# Patient Record
Sex: Female | Born: 1985 | Race: White | Hispanic: Yes | Marital: Single | Smoking: Never smoker
Health system: Southern US, Community
[De-identification: ages and names within clinical notes are randomized; demographics above are authoritative.]

## PROBLEM LIST (undated history)

## (undated) DIAGNOSIS — E282 Polycystic ovarian syndrome: Secondary | ICD-10-CM

## (undated) DIAGNOSIS — G43909 Migraine, unspecified, not intractable, without status migrainosus: Secondary | ICD-10-CM

## (undated) DIAGNOSIS — R87810 Cervical high risk human papillomavirus (HPV) DNA test positive: Secondary | ICD-10-CM

## (undated) DIAGNOSIS — F419 Anxiety disorder, unspecified: Secondary | ICD-10-CM

## (undated) DIAGNOSIS — R8789 Other abnormal findings in specimens from female genital organs: Secondary | ICD-10-CM

## (undated) HISTORY — DX: Polycystic ovarian syndrome: E28.2

## (undated) HISTORY — PX: BUTTOCK LIFT: SHX1278

## (undated) HISTORY — DX: Anxiety disorder, unspecified: F41.9

## (undated) HISTORY — DX: Migraine, unspecified, not intractable, without status migrainosus: G43.909

## (undated) HISTORY — DX: Cervical high risk human papillomavirus (HPV) DNA test positive: R87.810

---
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
## 2006-01-20 IMAGING — US US OB COMP +14 WK
1 series · 14 of 28 positions shown · non-contrast
Comparison: none

CLINICAL DATA: Pregnant with poor wt gain.
 ULTRASOUND OBSTETRICS COMPLETE +14 WEEKS:
 No comparison.  Single live intrauterine gestation is seen in breech lie.  The anterior placenta is free of the cervical os with normal amniotic fluid volume.  Normal fetal activity and cardiac rate and rhythm are identified.  BPD measures 4.0 corresponding to 18 weeks 2 days + / - 12 days.  Femur length measures 2.6 cm corresponding to 18 weeks 1 day + / - 13 days.  Head circumference measures 15.1 cm, with abdominal circumference of 12.7 cm which gives a head/abdominal circumference ratio of 1.19 with estimated fetal weight 228 + / - 34 grams.  Therefore, mean gestational age is 18 weeks 2 days + / - 13 days.  Fetal anatomic survey includes:  Lateral ventricles, posterior fossa, nuchal region, four chamber heart, fluid filled stomach and bladder, three vessel umbilical cord with its abdominal insertion, bilateral kidneys, and spine (sagittal only).

[Series 1: unknown · 14 of 50 slices shown]
[im 2/50]
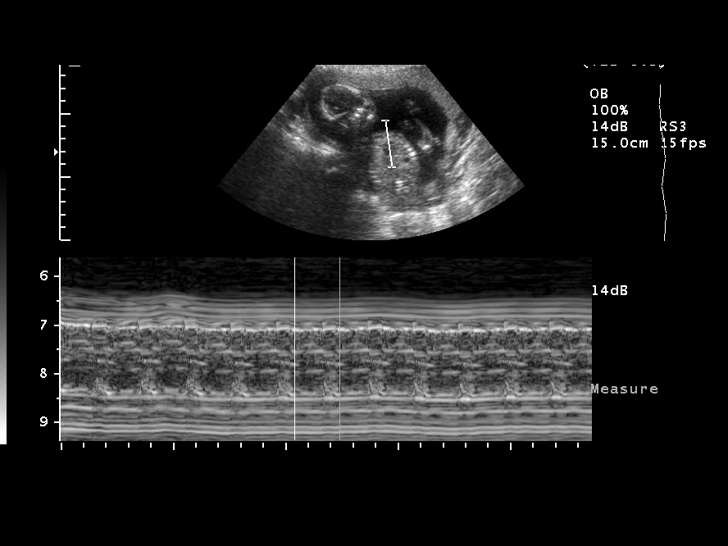
[im 6/50]
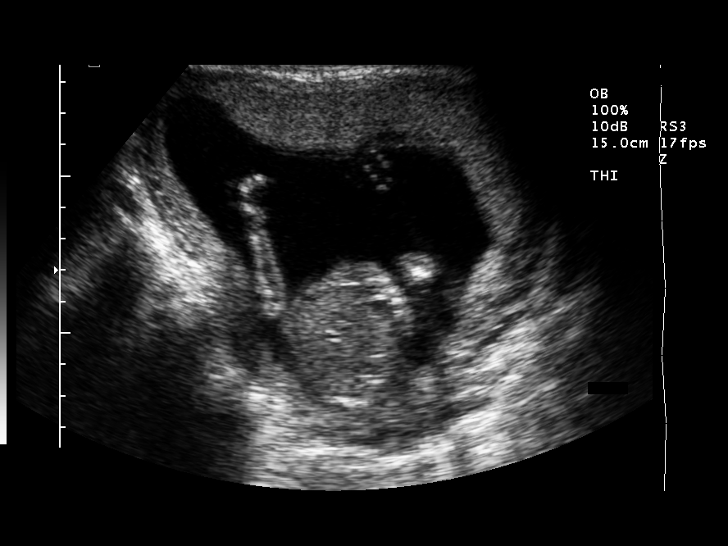
[im 10/50]
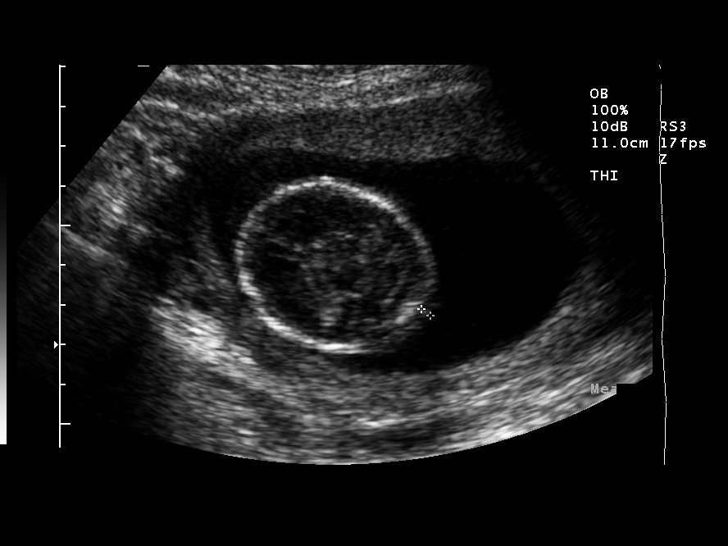
[im 13/50]
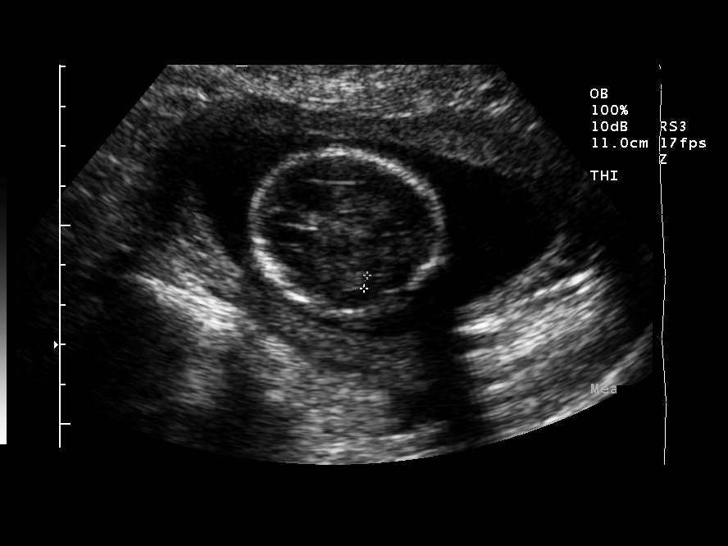
[im 17/50]
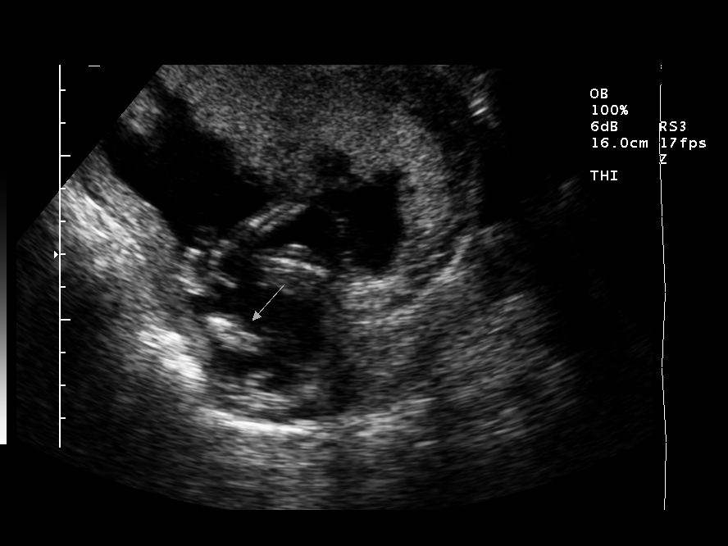
[im 20/50]
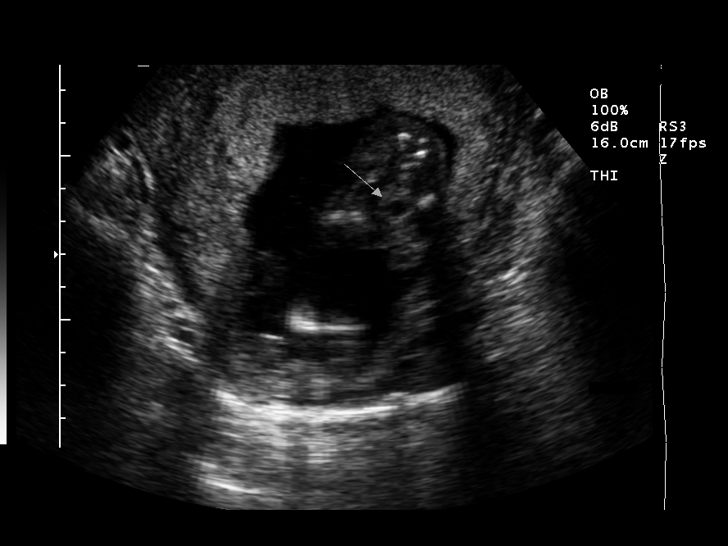
[im 24/50]
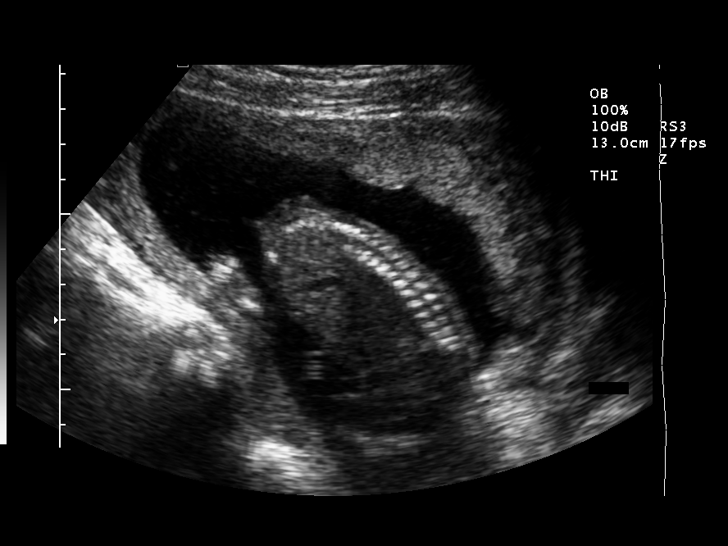
[im 28/50]
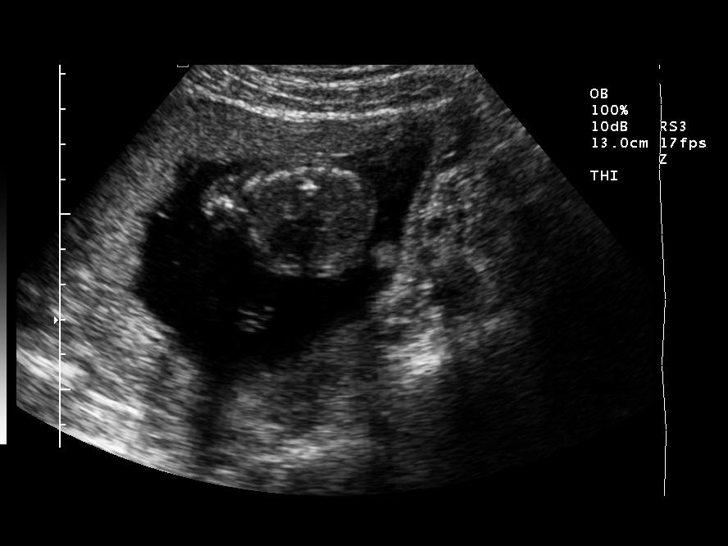
[im 31/50]
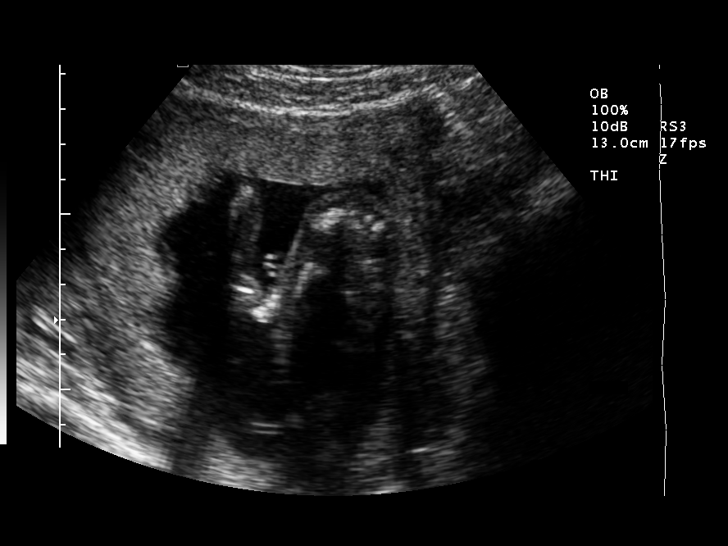
[im 35/50]
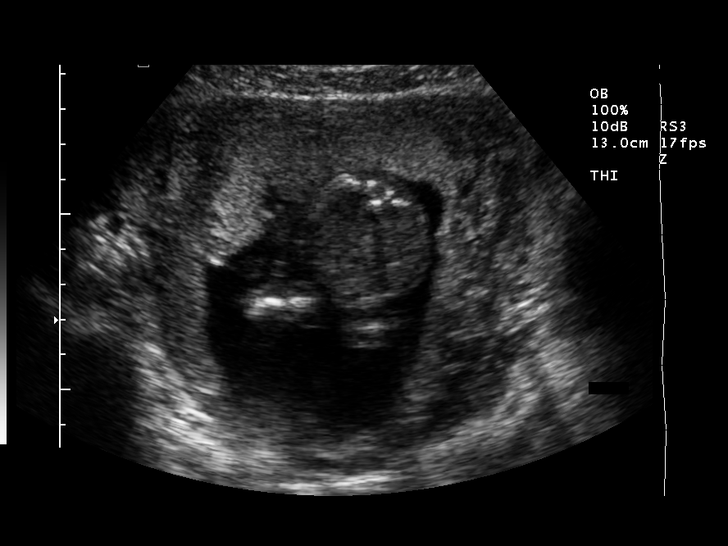
[im 39/50]
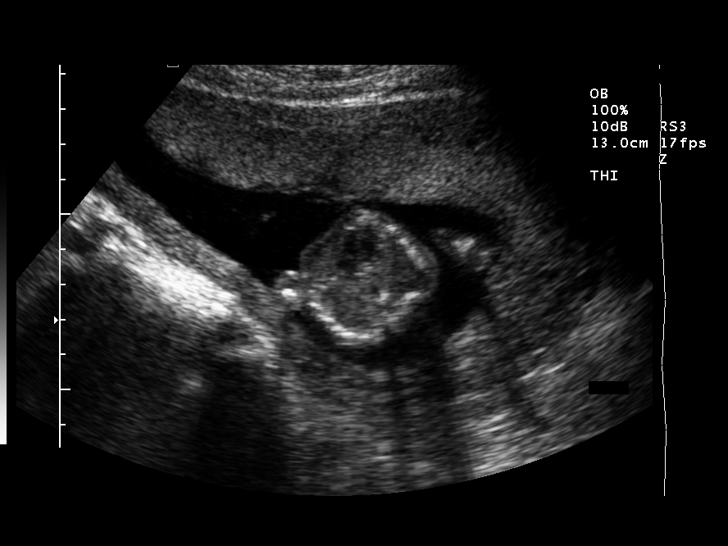
[im 42/50]
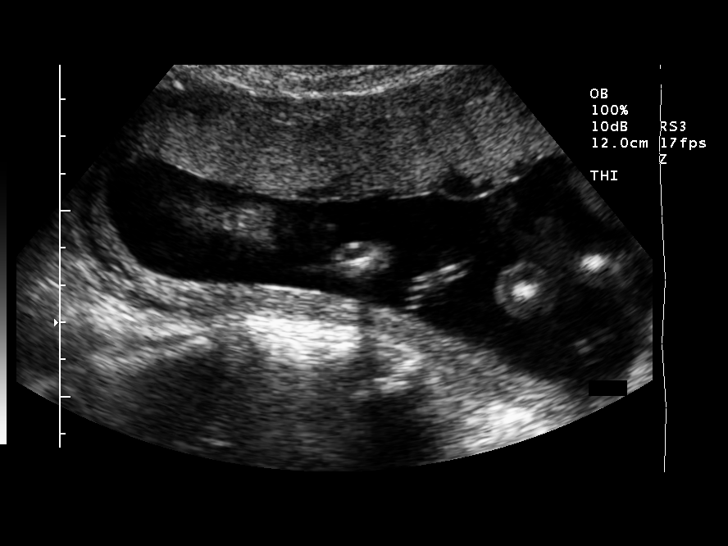
[im 46/50]
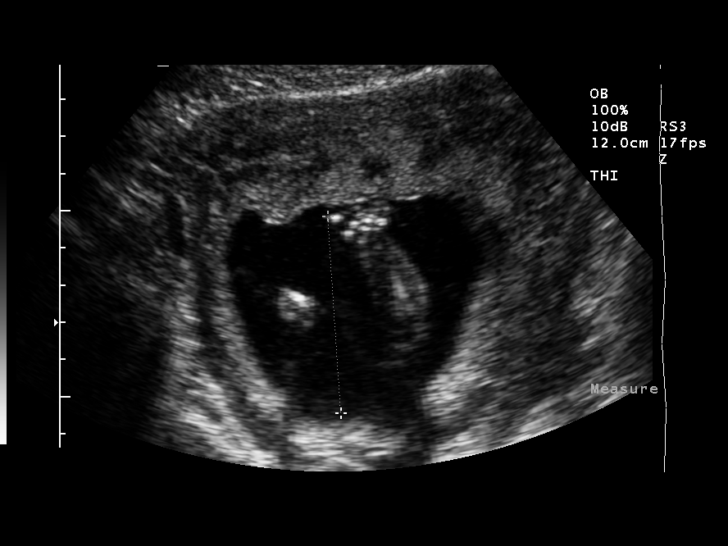
[im 50/50]
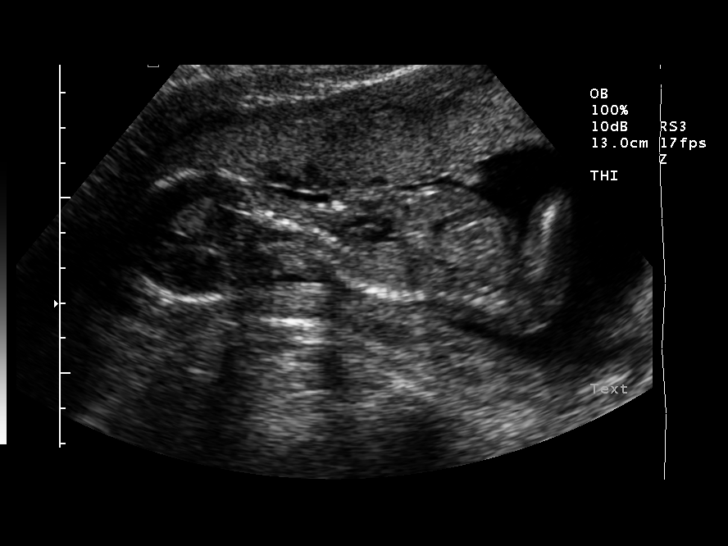

[14 of 28 positions shown; findings below may reference images not displayed]

IMPRESSION: 1.  Single live intrauterine gestation in breech lie.
 2.  Anterior placenta free of the cervical os. 
 3.  Normal amniotic fluid volume. 
 4.  Gestational dating 18 weeks 2 days + / -0 13 days.

## 2006-03-16 IMAGING — US US OB FOLLOW-UP
1 series · 14 of 28 positions shown · non-contrast
Comparison: none

CLINICAL DATA: Size versus dates discrepancy. 
 OBSTETRICAL ULTRASOUND REEVALUATION:
 There is a single intrauterine pregnancy with a heart rate of 147 BPM.  There is fetal movement.  No fetal breathing.  The presentation is breech.  The placenta is anterior and is not low-lying.  Amniotic fluid volume is normal.  BPD is 6.4 cm consistent with 25 weeks, 6 days.  Head circumference is 24 cm consistent with 26 weeks, 1 day.  Abdominal circumference is 20.8 cm consistent with 25 weeks, 3 days.  Femur length is 4.7 cm consistent with 25 weeks, 5 days.  Mean gestational age is 25 weeks, 6 days.  Gestational age by first ultrasound should be 26 weeks, 0 days. 
 The lateral ventricles, four chamber heart on the left, stomach on the left, cord insertion site, both kidneys, and bladder are identified on the current exam.  Cervical length is 3.3 cm.

[Series 1: unknown · 0.35mm/px · 14 of 35 slices shown]
[im 2/35]
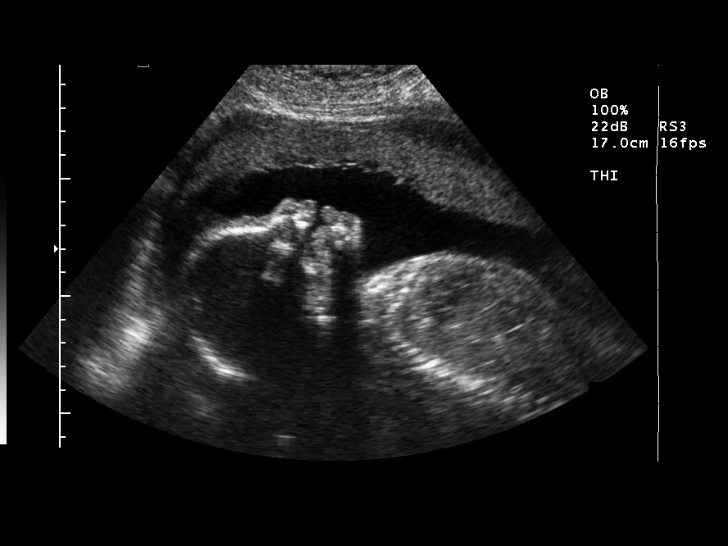
[im 4/35]
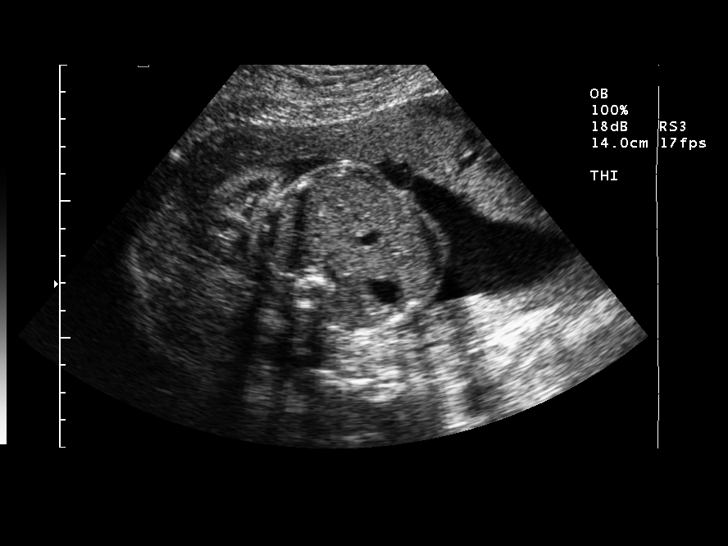
[im 7/35]
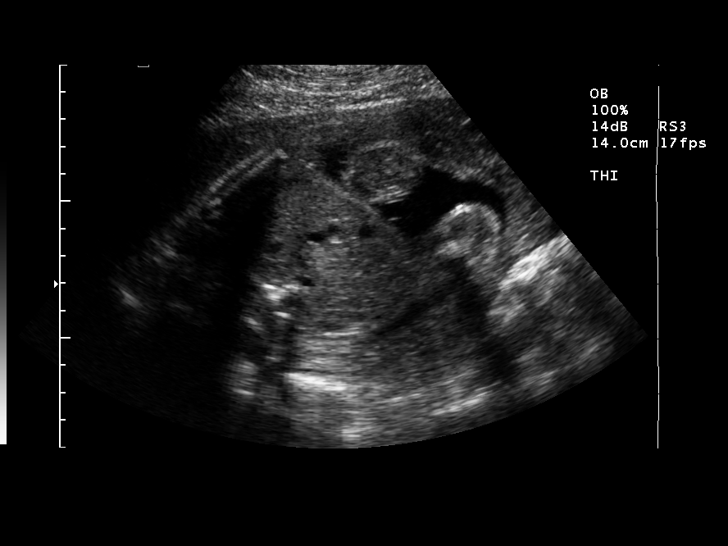
[im 9/35]
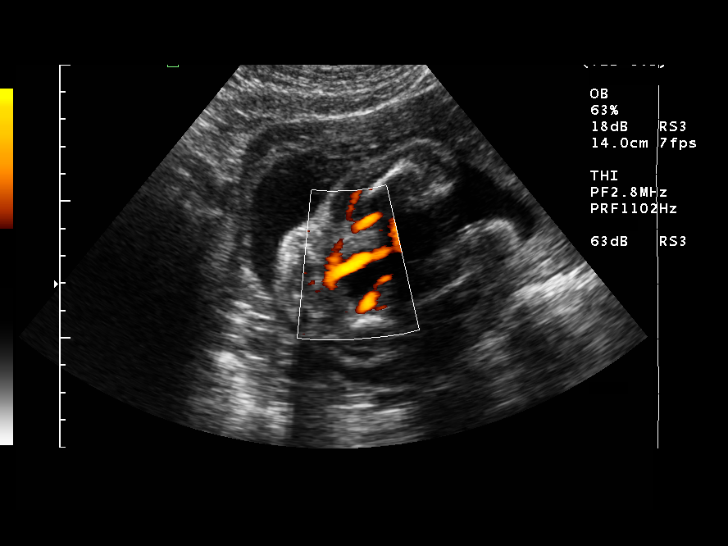
[im 12/35]
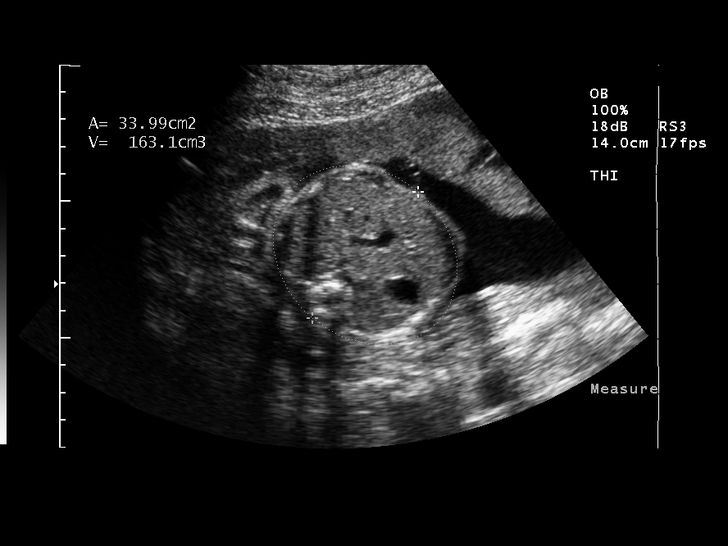
[im 14/35]
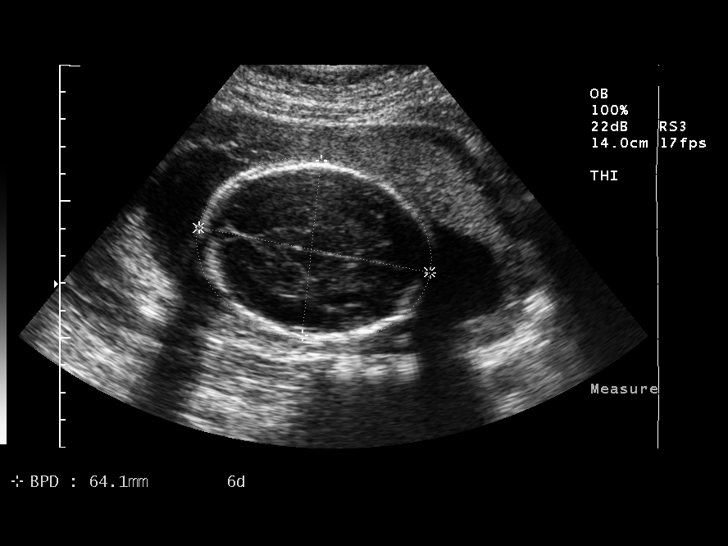
[im 17/35]
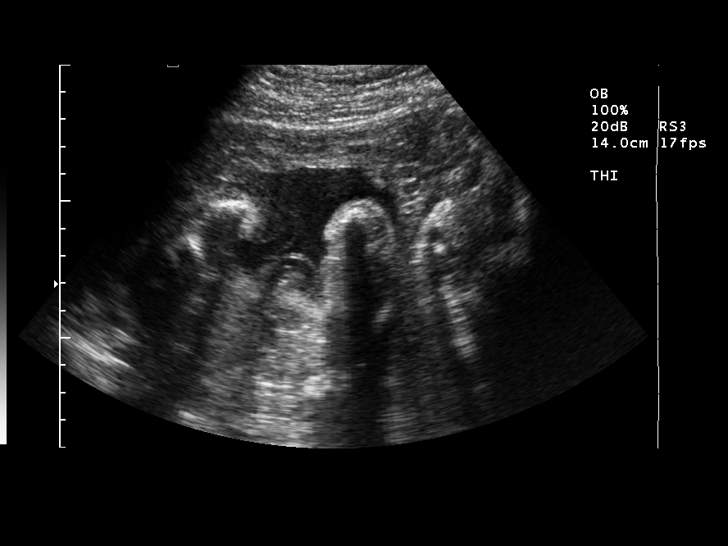
[im 19/35]
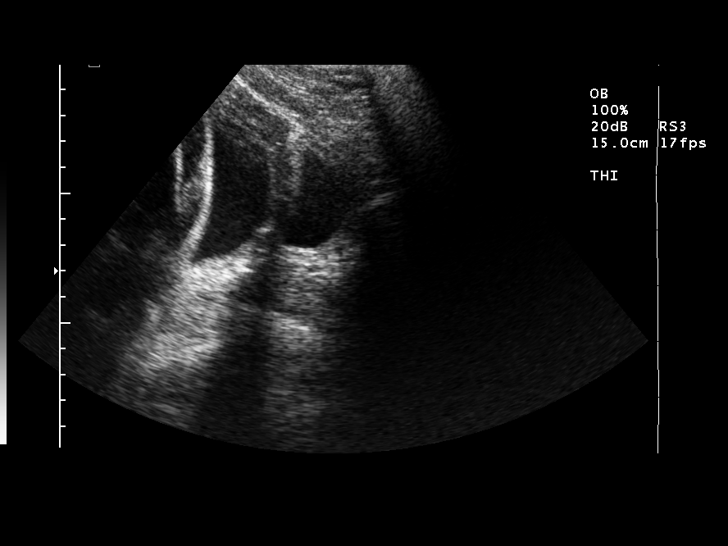
[im 22/35]
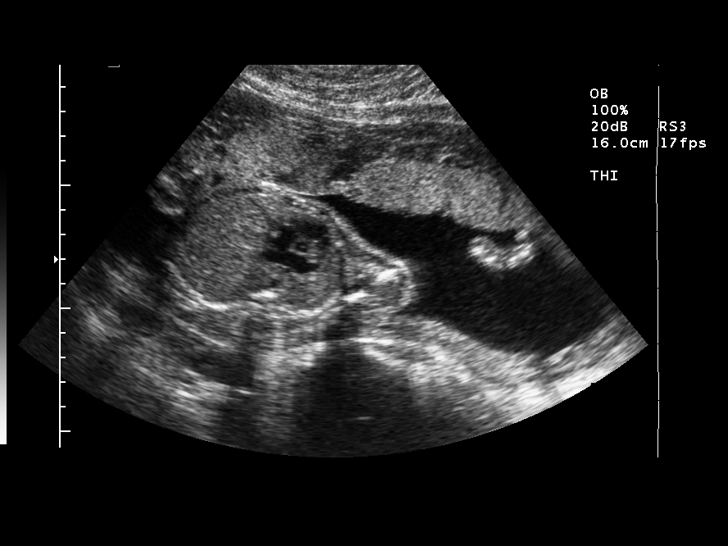
[im 24/35]
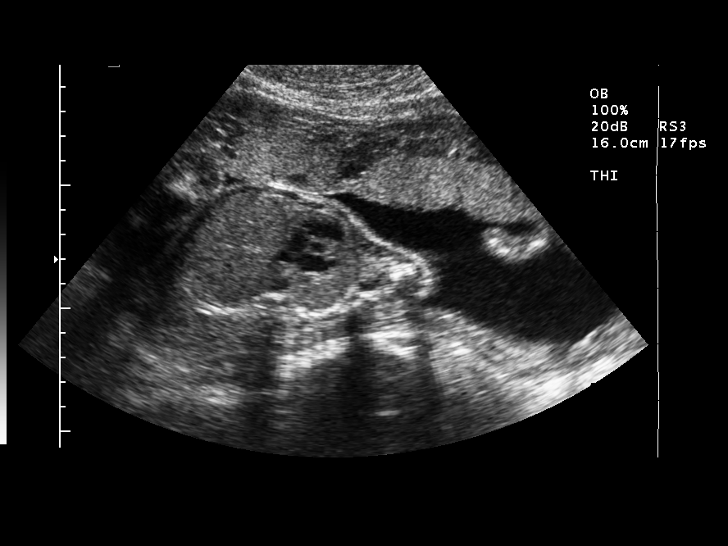
[im 27/35]
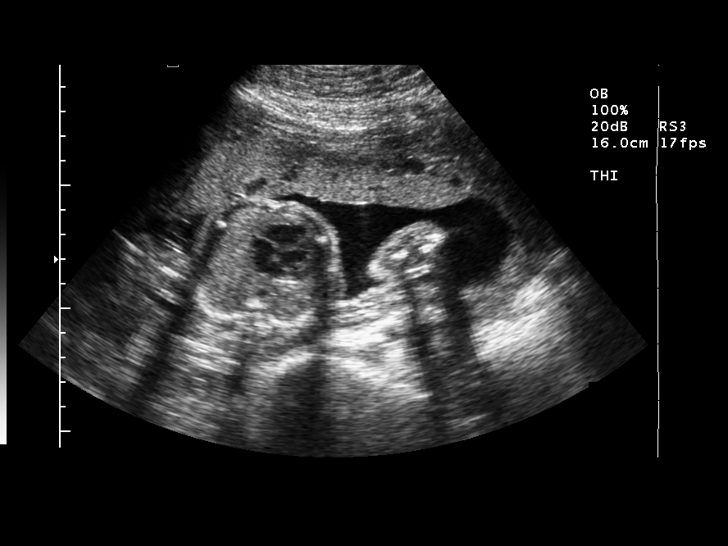
[im 29/35]
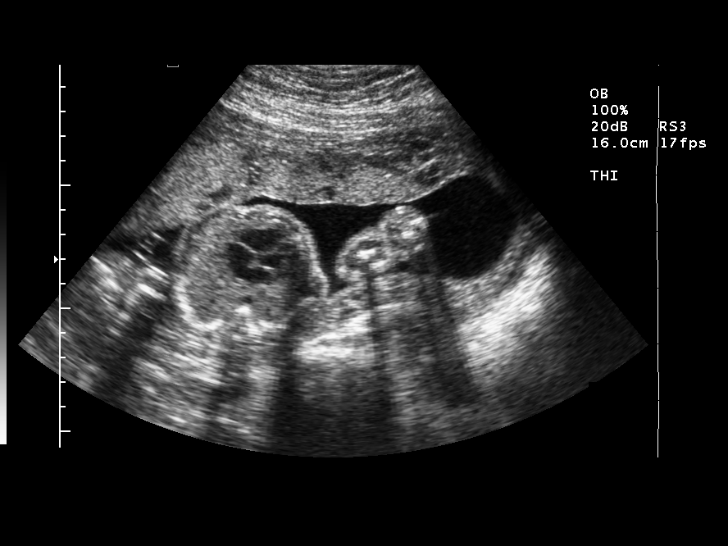
[im 32/35]
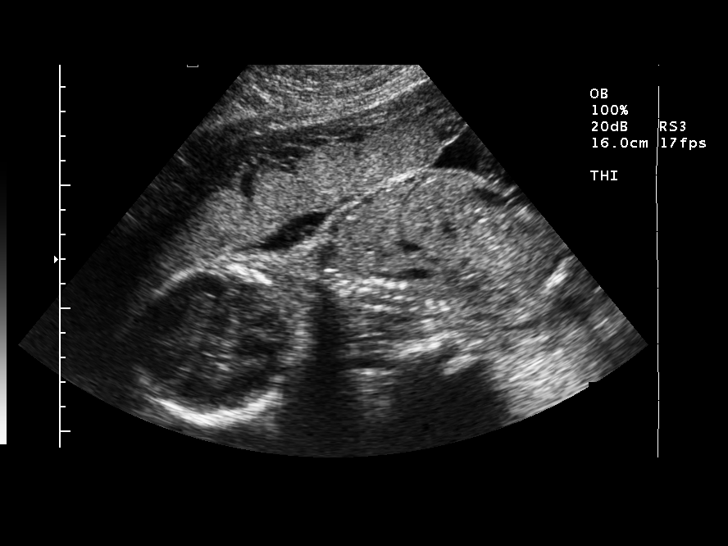
[im 35/35]
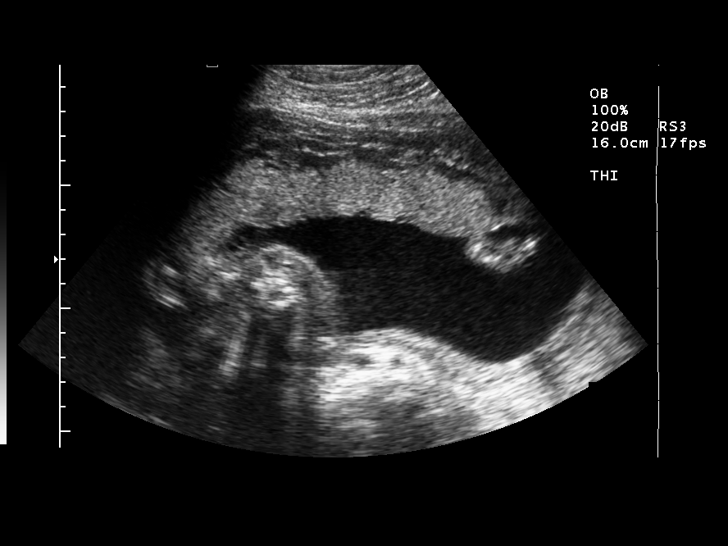

[14 of 28 positions shown; findings below may reference images not displayed]

IMPRESSION: Single intrauterine pregnancy of approximately 25 weeks, 6 days gestation, showing normal growth in the interval since the prior study of 03/12/04.

## 2010-04-24 ENCOUNTER — Encounter: Payer: Self-pay | Admitting: *Deleted

## 2015-10-28 ENCOUNTER — Encounter: Payer: Self-pay | Admitting: Pediatrics

## 2016-09-22 ENCOUNTER — Other Ambulatory Visit: Payer: Self-pay | Admitting: Adult Health

## 2016-09-28 ENCOUNTER — Other Ambulatory Visit: Payer: Self-pay | Admitting: Women's Health

## 2016-10-12 ENCOUNTER — Other Ambulatory Visit: Payer: Self-pay | Admitting: Women's Health

## 2017-01-01 ENCOUNTER — Encounter (HOSPITAL_COMMUNITY): Payer: Self-pay

## 2017-01-01 ENCOUNTER — Emergency Department (HOSPITAL_COMMUNITY)
Admission: EM | Admit: 2017-01-01 | Discharge: 2017-01-01 | Disposition: A | Discharge status: Home / Self Care | Payer: Medicaid Other | Attending: Emergency Medicine | Admitting: Emergency Medicine

## 2017-01-01 DIAGNOSIS — Y33XXXA Other specified events, undetermined intent, initial encounter: Secondary | ICD-10-CM | POA: Insufficient documentation

## 2017-01-01 DIAGNOSIS — Y929 Unspecified place or not applicable: Secondary | ICD-10-CM | POA: Insufficient documentation

## 2017-01-01 DIAGNOSIS — Y998 Other external cause status: Secondary | ICD-10-CM | POA: Insufficient documentation

## 2017-01-01 DIAGNOSIS — Z23 Encounter for immunization: Secondary | ICD-10-CM | POA: Diagnosis not present

## 2017-01-01 DIAGNOSIS — S6992XA Unspecified injury of left wrist, hand and finger(s), initial encounter: Secondary | ICD-10-CM | POA: Diagnosis present

## 2017-01-01 DIAGNOSIS — Y9389 Activity, other specified: Secondary | ICD-10-CM | POA: Insufficient documentation

## 2017-01-01 DIAGNOSIS — W450XXA Nail entering through skin, initial encounter: Secondary | ICD-10-CM

## 2017-01-01 MED ORDER — BUPIVACAINE HCL (PF) 0.25 % IJ SOLN
30.0000 mL | Freq: Once | INTRAMUSCULAR | Status: AC
  Administered 2017-01-01: 30 mL

## 2017-01-01 MED ORDER — TETANUS-DIPHTH-ACELL PERTUSSIS 5-2.5-18.5 LF-MCG/0.5 IM SUSP
INTRAMUSCULAR | Status: AC
  Filled 2017-01-01: qty 0.5

## 2017-01-01 MED ORDER — CLINDAMYCIN HCL 150 MG PO CAPS: 450.0000 mg | ORAL_CAPSULE | Freq: Three times a day (TID) | ORAL | 0 refills | Status: AC

## 2017-01-01 MED ORDER — IBUPROFEN 600 MG PO TABS: 600.0000 mg | ORAL_TABLET | Freq: Four times a day (QID) | ORAL | 0 refills | Status: DC | PRN

## 2017-01-01 MED ORDER — IBUPROFEN 800 MG PO TABS
ORAL_TABLET | ORAL | Status: AC
  Filled 2017-01-01: qty 1

## 2017-01-01 MED ORDER — BUPIVACAINE HCL (PF) 0.25 % IJ SOLN
INTRAMUSCULAR | Status: AC
  Administered 2017-01-01: 30 mL
  Filled 2017-01-01: qty 30

## 2017-01-01 MED ORDER — IBUPROFEN 800 MG PO TABS
800.0000 mg | ORAL_TABLET | Freq: Once | ORAL | Status: AC
  Administered 2017-01-01: 800 mg via ORAL

## 2017-01-01 MED ORDER — TETANUS-DIPHTH-ACELL PERTUSSIS 5-2.5-18.5 LF-MCG/0.5 IM SUSP
0.5000 mL | Freq: Once | INTRAMUSCULAR | Status: AC
  Administered 2017-01-01: 0.5 mL via INTRAMUSCULAR

## 2017-01-01 NOTE — ED Notes (Signed)
ED Provider at bedside. 

## 2017-01-01 NOTE — ED Triage Notes (Signed)
Reports of picking up paint container and left middle and fourth finger nail bent backwards.

## 2017-01-01 NOTE — ED Provider Notes (Signed)
[facility] Provider Note   CSN: [id number] Arrival date & time: 01/01/17  1458     History   Chief Complaint Chief Complaint  Patient presents with  . Nail Problem    HPI Caitlin Higgins is a 31 y.o. female with no significant past medical history presenting with nail injury to the left middle finger and ring finger. She explains that on Thursday she attempted to lift a heavy paint bucket and her nails separated from the nail bed. She currently has artificial long nails on. She put Band-Aids to hold it in place but then started to notice purulence, increased pain, redness, warmth to touch yesterday and the day before. Denies any numbness, fever, chills, nausea, vomiting.  HPI  History reviewed. No pertinent past medical history.  There are no active problems to display for this patient.   History reviewed. No pertinent surgical history.  OB History    No data available       Home Medications    Prior to Admission medications   Medication Sig Start Date End Date Taking? Authorizing Provider  clindamycin (CLEOCIN) 150 MG capsule Take 3 capsules (450 mg total) by mouth 3 (three) times daily. 01/01/17 01/08/17  Mathews Robinsons B, PA-C  ibuprofen (ADVIL,MOTRIN) 600 MG tablet Take 1 tablet (600 mg total) by mouth every 6 (six) hours as needed. 01/01/17   Georgiana Shore, PA-C    Family History No family history on file.  Social History Social History  Substance Use Topics  . Smoking status: Never Smoker  . Smokeless tobacco: Never Used  . Alcohol use No     Allergies   Patient has no known allergies.   Review of Systems Review of Systems  Constitutional: Negative for chills, fatigue and fever.  Gastrointestinal: Negative for nausea and vomiting.  Musculoskeletal: Negative for arthralgias, back pain, myalgias, neck pain and neck stiffness.  Skin: Positive for wound. Negative for color change, pallor and rash.       Nail injury  Neurological: Negative  for dizziness, seizures, syncope, weakness and numbness.     Physical Exam Updated Vital Signs BP 111/77 (BP Location: Right Arm)   Pulse (!) 58   Temp 98.3 F (36.8 C) (Oral)   Resp 18   Ht  (1.676 m)   Wt 65.8 kg (145 lb)   LMP  (LMP Unknown)   SpO2 100%   BMI 23.40 kg/m   Physical Exam  Constitutional: She appears well-developed and well-nourished. No distress.  Afebrile, nontoxic-appearing, sitting comfortably in bed in no acute distress.  HENT:  Head: Normocephalic and atraumatic.  Cardiovascular: Normal rate, regular rhythm and normal heart sounds.   No murmur heard. Pulmonary/Chest: Effort normal and breath sounds normal. No respiratory distress. She has no wheezes.  Abdominal: She exhibits no distension.  Musculoskeletal: Normal range of motion. She exhibits tenderness. She exhibits no edema or deformity.  Neurological: She is alert. No sensory deficit.  Skin: Skin is warm and dry. Capillary refill takes less than 2 seconds. No rash noted. She is not diaphoretic. There is erythema. No pallor.  Both nails are currently held in place with dried blood surrounding the nailbed and purulence distally under the nail.  Psychiatric: She has a normal mood and affect.  Nursing note and vitals reviewed.    ED Treatments / Results  Labs (all labs ordered are listed, but only abnormal results are displayed) Labs Reviewed - No data to display  EKG  EKG Interpretation None  Radiology No results found.  Procedures Procedures (including critical care time) NERVE BLOCK Performed by: Georgiana Shore and observed PA student Consent: Verbal consent obtained. Required items: required blood products, implants, devices, and special equipment available Time out: Immediately prior to procedure a "time out" was called to verify the correct patient, procedure, equipment, support staff and site/side marked as required.  Indication: pain and nail removal Nerve block  body site: left ring finger   Preparation: Patient was prepped and draped in the usual sterile fashion. Needle gauge: 23 G Location technique: anatomical landmarks  Local anesthetic: bupivacaine  Anesthetic total: 3 ml  Outcome: pain improved Patient tolerance: Patient tolerated the procedure well with no immediate complications.  NERVE BLOCK Performed by: Georgiana Shore Consent: Verbal consent obtained. Required items: required blood products, implants, devices, and special equipment available Time out: Immediately prior to procedure a "time out" was called to verify the correct patient, procedure, equipment, support staff and site/side marked as required.  Indication: pain and nail removal Nerve block body site: left ring finger  Preparation: Patient was prepped and draped in the usual sterile fashion. Needle gauge: 23 G Location technique: anatomical landmarks  Local anesthetic: bupivacaine   Anesthetic total: 0.25 ml  Outcome: pain improved Patient tolerance: Patient tolerated the procedure well with no immediate complications.    Medications Ordered in ED Medications  ibuprofen (ADVIL,MOTRIN) tablet 800 mg (800 mg Oral Given 01/01/17 1545)  bupivacaine (PF) (MARCAINE) 0.25 % injection 30 mL (30 mLs Infiltration Given by Other 01/01/17 1748)  Tdap (BOOSTRIX) injection 0.5 mL (0.5 mLs Intramuscular Given 01/01/17 1747)     Initial Impression / Assessment and Plan / ED Course  I have reviewed the triage vital signs and the nursing notes.  Pertinent labs & imaging results that were available during my care of the patient were reviewed by me and considered in my medical decision making (see chart for details).     After soaking in softening around the cuticle, both nails loosened and revealed detachment from the nailbed.  Patient improved while in the emergency department, nails trimmed to shorter length, dressing with padding applied. Nails remain attached at the  matrix, but completely detached from the nailbed. Tetanus updated.  Wound cleansed, debrided of foreign material as much as possible, and dressed. The patient was alerted to watch for any signs of infection (redness, purulence, pain, increased swelling or fever) and call if such occurs. Home wound care instructions are provided. Tetanus vaccination updated.  Will discharge home with Symptomatic relief and antibiotics and close follow-up with PCP.  Discussed strict return precautions and advised to return to the emergency department if experiencing any new or worsening symptoms including signs of infection. Instructions were understood and patient agreed with discharge plan.  Final Clinical Impressions(s) / ED Diagnoses   Final diagnoses:  Nail, injury by, initial encounter    New Prescriptions Discharge Medication List as of 01/01/2017  5:45 PM    START taking these medications   Details  clindamycin (CLEOCIN) 150 MG capsule Take 3 capsules (450 mg total) by mouth 3 (three) times daily., Starting Sun 01/01/2017, Until Sun 01/08/2017, Print    ibuprofen (ADVIL,MOTRIN) 600 MG tablet Take 1 tablet (600 mg total) by mouth every 6 (six) hours as needed., Starting Sun 01/01/2017, Print         Clovis Riley Benedict B, PA-C 01/01/17 Raymondo Band, MD 01/03/17 [phone]

## 2017-01-01 NOTE — Discharge Instructions (Signed)
As discussed, make sure to keep the area clean and dry with clean dressing changed every day as needed. Take entire course of antibiotics even if you feel better. Follow-up with the [facility] to establish care with a primary care provider and for follow-up.  Return if any worsening, numbness, increased swelling, increased pain, fever, chills or any other new concerning symptoms in the meantime.

## 2017-01-19 ENCOUNTER — Encounter: Payer: Self-pay | Admitting: Adult Health

## 2017-01-19 ENCOUNTER — Other Ambulatory Visit (HOSPITAL_COMMUNITY)
Admission: RE | Admit: 2017-01-19 | Discharge: 2017-01-19 | Disposition: A | Discharge status: Home / Self Care | Payer: Medicaid Other | Source: Ambulatory Visit | Attending: Adult Health | Admitting: Adult Health

## 2017-01-19 ENCOUNTER — Ambulatory Visit (INDEPENDENT_AMBULATORY_CARE_PROVIDER_SITE_OTHER): Payer: Medicaid Other | Admitting: Adult Health

## 2017-01-19 VITALS — BP 98/60 | HR 69 | Ht 66.0 in | Wt 155.5 lb

## 2017-01-19 DIAGNOSIS — Z975 Presence of (intrauterine) contraceptive device: Secondary | ICD-10-CM

## 2017-01-19 DIAGNOSIS — R319 Hematuria, unspecified: Secondary | ICD-10-CM

## 2017-01-19 DIAGNOSIS — Z124 Encounter for screening for malignant neoplasm of cervix: Secondary | ICD-10-CM

## 2017-01-19 DIAGNOSIS — Z01411 Encounter for gynecological examination (general) (routine) with abnormal findings: Secondary | ICD-10-CM

## 2017-01-19 DIAGNOSIS — N390 Urinary tract infection, site not specified: Secondary | ICD-10-CM | POA: Diagnosis not present

## 2017-01-19 DIAGNOSIS — Z0001 Encounter for general adult medical examination with abnormal findings: Secondary | ICD-10-CM

## 2017-01-19 DIAGNOSIS — Z113 Encounter for screening for infections with a predominantly sexual mode of transmission: Secondary | ICD-10-CM

## 2017-01-19 DIAGNOSIS — R350 Frequency of micturition: Secondary | ICD-10-CM | POA: Diagnosis not present

## 2017-01-19 DIAGNOSIS — Z01419 Encounter for gynecological examination (general) (routine) without abnormal findings: Secondary | ICD-10-CM

## 2017-01-19 LAB — POCT URINALYSIS DIPSTICK
Glucose, UA: NEGATIVE
Ketones, UA: NEGATIVE
Leukocytes, UA: NEGATIVE
Nitrite, UA: POSITIVE
Protein, UA: NEGATIVE

## 2017-01-19 MED ORDER — SULFAMETHOXAZOLE-TRIMETHOPRIM 800-160 MG PO TABS: 1.0000 | ORAL_TABLET | Freq: Two times a day (BID) | ORAL | 0 refills | Status: DC

## 2017-01-19 NOTE — Progress Notes (Signed)
Patient ID: Caitlin Higgins, female   DOB: Jun 27, 1985, 31 y.o.   MRN: [id number] History of Present Illness: Caitlin Higgins is a 31 year old Hispanic female, separated in for well woman gyn exam and pap.She has IUD, since 2016 and no periods.    Current Medications, Allergies, Past Medical History, Past Surgical History, Family History and Social History were reviewed in [facility] electronic medical record.     Review of Systems:  Patient denies any headaches, hearing loss, fatigue, blurred vision, shortness of breath, chest pain, abdominal pain, problems with bowel movements, or intercourse(not currently having sex). No joint pain or mood swings. +urinary frequency   Physical Exam:BP 98/60 (BP Location: Left Arm, Patient Position: Sitting, Cuff Size: Normal)   Pulse 69   Ht 5\' 6"  (1.676 m)   Wt 155 lb 8 oz (70.5 kg)   LMP  (LMP Unknown)   BMI 25.10 kg/m urine trace blood, and +nitrates. General:  Well developed, well nourished, no acute distress Skin:  Warm and dry Neck:  Midline trachea, normal thyroid, good ROM, no lymphadenopathy Lungs; Clear to auscultation bilaterally Breast:  No dominant palpable mass, retraction, or nipple discharge Cardiovascular: Regular rate and rhythm Abdomen:  Soft, non tender, no hepatosplenomegaly Pelvic:  External genitalia is normal in appearance, no lesions.  The vagina is normal in appearance. Urethra has no lesions or masses. The cervix is bulbous. +IUD, pap with GC/CHL and HPV performed Uterus is felt to be normal size, shape, and contour.  No adnexal masses or tenderness noted.Bladder is non tender, no masses felt. Extremities/musculoskeletal:  No swelling or varicosities noted, no clubbing or cyanosis Psych:  No mood changes, alert and cooperative,seems happy PHQ 2 score 0.  Impression: 1. Encounter for routine gynecological examination with Papanicolaou smear of cervix   2. Urinary tract infection with hematuria, site unspecified   3. Urinary  frequency   4. Hematuria, unspecified type   5. Routine cervical smear   6. IUD (intrauterine device) in place   7. Screening examination for STD (sexually transmitted disease)       Plan:  Check CBC,CMP,TSH and lipids,HIV and RPR Rx septra ds 1 bid x 7 days UA C&S sent Physical in 1 year Pap in 3 if normal

## 2017-01-20 LAB — COMPREHENSIVE METABOLIC PANEL
ALT: 15 IU/L (ref 0–32)
AST: 17 IU/L (ref 0–40)
Albumin/Globulin Ratio: 1.8 (ref 1.2–2.2)
Albumin: 4.4 g/dL (ref 3.5–5.5)
Alkaline Phosphatase: 78 IU/L (ref 39–117)
BUN/Creatinine Ratio: 13 (ref 9–23)
BUN: 7 mg/dL (ref 6–20)
Bilirubin Total: 1.2 mg/dL (ref 0.0–1.2)
CO2: 25 mmol/L (ref 20–29)
Calcium: 9.2 mg/dL (ref 8.7–10.2)
Chloride: 103 mmol/L (ref 96–106)
Creatinine, Ser: 0.52 mg/dL — ABNORMAL LOW (ref 0.57–1.00)
GFR calc Af Amer: 148 mL/min/{1.73_m2} (ref >59)
GFR calc non Af Amer: 129 mL/min/{1.73_m2} (ref >59)
Globulin, Total: 2.4 g/dL (ref 1.5–4.5)
Glucose: 95 mg/dL (ref 65–99)
Potassium: 4.1 mmol/L (ref 3.5–5.2)
Sodium: 141 mmol/L (ref 134–144)
Total Protein: 6.8 g/dL (ref 6.0–8.5)

## 2017-01-20 LAB — LIPID PANEL
Chol/HDL Ratio: 4.7 ratio — ABNORMAL HIGH (ref 0.0–4.4)
Cholesterol, Total: 155 mg/dL (ref 100–199)
HDL: 33 mg/dL — ABNORMAL LOW (ref >39)
LDL Calculated: 105 mg/dL — ABNORMAL HIGH (ref 0–99)
Triglycerides: 84 mg/dL (ref 0–149)
VLDL Cholesterol Cal: 17 mg/dL (ref 5–40)

## 2017-01-20 LAB — URINALYSIS, ROUTINE W REFLEX MICROSCOPIC
Bilirubin, UA: NEGATIVE
Glucose, UA: NEGATIVE
Ketones, UA: NEGATIVE
Nitrite, UA: POSITIVE — AB
Protein, UA: NEGATIVE
RBC, UA: NEGATIVE
Specific Gravity, UA: 1.021 (ref 1.005–1.030)
Urobilinogen, Ur: 0.2 mg/dL (ref 0.2–1.0)
pH, UA: 5 (ref 5.0–7.5)

## 2017-01-20 LAB — CBC
Hematocrit: 42.6 % (ref 34.0–46.6)
Hemoglobin: 14 g/dL (ref 11.1–15.9)
MCH: 30.2 pg (ref 26.6–33.0)
MCHC: 32.9 g/dL (ref 31.5–35.7)
MCV: 92 fL (ref 79–97)
Platelets: 226 10*3/uL (ref 150–379)
RBC: 4.63 x10E6/uL (ref 3.77–5.28)
RDW: 12.5 % (ref 12.3–15.4)
WBC: 6.4 10*3/uL (ref 3.4–10.8)

## 2017-01-20 LAB — MICROSCOPIC EXAMINATION
Casts: NONE SEEN /lpf
Epithelial Cells (non renal): >10 /hpf — AB (ref 0–10)

## 2017-01-20 LAB — HIV ANTIBODY (ROUTINE TESTING W REFLEX): HIV Screen 4th Generation wRfx: NONREACTIVE

## 2017-01-20 LAB — TSH: TSH: 1.21 u[IU]/mL (ref 0.450–4.500)

## 2017-01-20 LAB — RPR: RPR Ser Ql: NONREACTIVE

## 2017-01-21 LAB — URINE CULTURE

## 2017-01-23 ENCOUNTER — Telehealth: Payer: Self-pay | Admitting: Adult Health

## 2017-01-23 NOTE — Telephone Encounter (Signed)
Pt aware of labs and to finish septra ds, urine +Ecoli

## 2017-01-24 ENCOUNTER — Encounter: Payer: Self-pay | Admitting: Adult Health

## 2017-01-24 ENCOUNTER — Telehealth: Payer: Self-pay | Admitting: Adult Health

## 2017-01-24 DIAGNOSIS — R87810 Cervical high risk human papillomavirus (HPV) DNA test positive: Secondary | ICD-10-CM

## 2017-01-24 HISTORY — DX: Cervical high risk human papillomavirus (HPV) DNA test positive: R87.810

## 2017-01-24 LAB — CYTOLOGY - PAP
Chlamydia: NEGATIVE
Diagnosis: NEGATIVE
HPV: DETECTED
Neisseria Gonorrhea: NEGATIVE

## 2017-01-24 NOTE — Telephone Encounter (Signed)
Pt aware pap negative for malignancy but +HPV, repeat pap in 1 year  

## 2017-02-14 ENCOUNTER — Encounter: Payer: Self-pay | Admitting: Women's Health

## 2017-02-14 ENCOUNTER — Ambulatory Visit: Payer: Medicaid Other | Admitting: Women's Health

## 2017-02-14 VITALS — BP 122/68 | HR 67 | Ht 66.0 in | Wt 160.5 lb

## 2017-02-14 DIAGNOSIS — Z30432 Encounter for removal of intrauterine contraceptive device: Secondary | ICD-10-CM

## 2017-02-14 NOTE — Progress Notes (Signed)
   IUD REMOVAL  Patient name: Caitlin JubileeDeisy D Mccluskey MRN [id number]  Date of birth: 06-26-1985 Subjective Findings:   Caitlin Higgins is a 31 y.o. 512P2002 Hispanic female being seen today for removal of a Mirena IUD. Her IUD was placed 2016. States she began having LLQ pain the other night w/ sex, felt like it was the IUD. Also [place], needs IUD out for this- she showed me the website which does say remove IUD as well as can not be on any type of birth control. Discussed I do not know why this would be needed, and removing IUD of course increases risks of pregnancy. Also needs labs w/in 30d of procedure prior to boarding flight: CBC w/ diff, CMP, PT-INR, PTT, HCG Qual, HIV, UA. Discussed these will have to be out of pocket as I will not run them through Medicaid as this is an elective procedure. Pt understands.  Will also have further bloodwork at her pre-op when [place] in [place]. Will have further testing. Signed copy of informed consent in chart.   No LMP recorded. Patient is not currently having periods (Reason: IUD). Last pap10/18/18. Results were:  neg w/ +HRHPV The planned method of family planning is abstinence Pertinent History Reviewed:   Reviewed past medical,surgical, social, obstetrical and family history.  Reviewed problem list, medications and allergies. Objective Findings & Procedure:    Vitals:   02/14/17 1348  BP: 122/68  Pulse: 67  Weight: 160 lb 8 oz (72.8 kg)  Height: 5\' 6"  (1.676 m)  Body mass index is 25.91 kg/m.  No results found for this or any previous visit (from the past 24 hour(s)).   Time out was performed.  A graves speculum was placed in the vagina.  The cervix was visualized, and the strings were visible. They were grasped and the Mirena IUD was easily removed intact without complications. The patient tolerated the procedure well.  Assessment & Plan:   1) Mirena IUD removal 2) [place]  [place], [place] butt [place]. Wrote order for needed pre-op labs as listed on website on rx pad, to run as self-pay. CBC w/ diff, CMP, PT-INR, PTT, HCG Qual, HIV, UA, pt wants to come have drawn 11/27.  Follow-up prn problems  No orders of the defined types were placed in this encounter.   Follow-up: Return for 11/27 for labs.  Marge DuncansBooker, Kimberly Randall CNM, [facility]BC 02/14/2017 2:20 PM

## 2017-03-09 ENCOUNTER — Telehealth: Payer: Self-pay | Admitting: Women's Health

## 2017-03-09 NOTE — Telephone Encounter (Signed)
Pt called stating that she had her IUD removed because she is having a procedure done. She states that after her procedure she is able to start oral birth control pills. She requests a prescription for that. Informed pt that she would need to schedule an appt for that because we have to do a pregnancy test before prescribing. Pt connected to scheduling.

## 2017-04-26 ENCOUNTER — Encounter: Payer: Self-pay | Admitting: Adult Health

## 2017-04-26 ENCOUNTER — Ambulatory Visit: Payer: Medicaid Other | Admitting: Adult Health

## 2017-04-26 VITALS — BP 122/66 | HR 64 | Ht 66.0 in | Wt 155.0 lb

## 2017-04-26 DIAGNOSIS — Z3009 Encounter for other general counseling and advice on contraception: Secondary | ICD-10-CM

## 2017-04-26 NOTE — Progress Notes (Signed)
Subjective:     Patient ID: Caitlin Higgins, female   DOB: 01-13-1986, 32 y.o.   MRN: [id number]  HPI Caitlin HooverDeisy is a 32 year old Hispanic female in to discuss birth control.She had IUD removed 02/14/17.  Review of Systems  On period now  Reviewed past medical,surgical, social and family history. Reviewed medications and allergies.     Objective:   Physical Exam BP 122/66 (BP Location: Left Arm, Patient Position: Sitting, Cuff Size: Normal)   Pulse 64   Ht 5\' 6"  (1.676 m)   Wt 155 lb (70.3 kg)   LMP 04/25/2017   BMI 25.02 kg/m Talk only; she has had IUD and wants another one she thinks, but ansered questions about nexplanon too.She is undergoing laser for facial hair removal and asked if IUD would help that, explained it would not, could use OCs, she declines. She had butt lift in December and is sitting on pillow today.     Assessment:     1. Encounter for general counseling and advice on contraceptive management       Plan:     Return in 2 days for IUD insertion

## 2017-04-28 ENCOUNTER — Ambulatory Visit (INDEPENDENT_AMBULATORY_CARE_PROVIDER_SITE_OTHER): Payer: Medicaid Other | Admitting: Women's Health

## 2017-04-28 ENCOUNTER — Encounter: Payer: Self-pay | Admitting: Women's Health

## 2017-04-28 VITALS — BP 112/76 | HR 69 | Ht 66.0 in | Wt 157.0 lb

## 2017-04-28 DIAGNOSIS — Z3202 Encounter for pregnancy test, result negative: Secondary | ICD-10-CM | POA: Diagnosis not present

## 2017-04-28 DIAGNOSIS — Z30430 Encounter for insertion of intrauterine contraceptive device: Secondary | ICD-10-CM | POA: Diagnosis not present

## 2017-04-28 HISTORY — DX: Encounter for insertion of intrauterine contraceptive device: Z30.430

## 2017-04-28 LAB — POCT URINE PREGNANCY: Preg Test, Ur: NEGATIVE

## 2017-04-28 MED ORDER — LEVONORGESTREL 20 MCG/24HR IU IUD
INTRAUTERINE_SYSTEM | Freq: Once | INTRAUTERINE | Status: AC
  Administered 2017-04-28: 11:00:00 via INTRAUTERINE

## 2017-04-28 NOTE — Progress Notes (Signed)
   IUD INSERTION Patient name: Caitlin Higgins MRN [id number]  Date of birth: March 06, 1986 Subjective Findings:   Caitlin Higgins is a 32 y.o. 552P2002 Hispanic female being seen today for insertion of a Mirena IUD.   Patient's last menstrual period was 04/25/2017. Last sexual intercourse was ~1-2wks ago, but she is on her period Last pap10/18/18. Results were:  neg w/ +HRHPV  The risks and benefits of the method and placement have been thouroughly reviewed with the patient and all questions were answered.  Specifically the patient is aware of failure rate of 04/998, expulsion of the IUD and of possible perforation.  The patient is aware of irregular bleeding due to the method and understands the incidence of irregular bleeding diminishes with time.  Signed copy of informed consent in chart.  Pertinent History Reviewed:   Reviewed past medical,surgical, social, obstetrical and family history.  Reviewed problem list, medications and allergies. Objective Findings & Procedure:   Vitals:   04/28/17 0924  BP: 112/76  Pulse: 69  Weight: 157 lb (71.2 kg)  Height: 5\' 6"  (1.676 m)  Body mass index is 25.34 kg/m.  Results for orders placed or performed in visit on 04/28/17 (from the past 24 hour(s))  POCT urine pregnancy   Collection Time: 04/28/17  9:33 AM  Result Value Ref Range   Preg Test, Ur Negative Negative     Time out was performed.  A graves speculum was placed in the vagina.  The cervix was visualized, prepped using Betadine, and grasped with a single tooth tenaculum. The uterus was found to be retroflexed and it sounded to 8 cm.  Mirena IUD placed per manufacturer's recommendations. The strings were trimmed to approximately 3 cm. The patient tolerated the procedure well.   Informal transvaginal sonogram was performed and the proper placement of the IUD was verified. Assessment & Plan:   1) Mirena IUD insertion The patient was given post procedure instructions, including signs and  symptoms of infection and to check for the strings after each menses or each month, and refraining from intercourse or anything in the vagina for 3 days. She was given a Mirena care card with date IUD placed, and date IUD to be removed. She is scheduled for a f/u appointment in 4 weeks.  Orders Placed This Encounter  Procedures  . POCT urine pregnancy    Return in about 4 weeks (around 05/26/2017) for F/U.  Marge DuncansBooker, Ronak Duquette Randall CNM, [facility]BC 04/28/2017 10:42 AM

## 2017-04-28 NOTE — Patient Instructions (Addendum)
 Nothing in vagina for 3 days (no sex, douching, tampons, etc...)  Check your strings once a month to make sure you can feel them, if you are not able to please let us know  If you develop a fever of 100.4 or more in the next few weeks, or if you develop severe abdominal pain, please let us know  Use a backup method of birth control, such as condoms, for 2 weeks    Intrauterine Device Insertion, Care After This sheet gives you information about how to care for yourself after your procedure. Your health care provider may also give you more specific instructions. If you have problems or questions, contact your health care provider. What can I expect after the procedure? After the procedure, it is common to have:  Cramps and pain in the abdomen.  Light bleeding (spotting) or heavier bleeding that is like your menstrual period. This may last for up to a few days.  Lower back pain.  Dizziness.  Headaches.  Nausea.  Follow these instructions at home:  Before resuming sexual activity, check to make sure that you can feel the IUD string(s). You should be able to feel the end of the string(s) below the opening of your cervix. If your IUD string is in place, you may resume sexual activity. ? If you had a hormonal IUD inserted more than 7 days after your most recent period started, you will need to use a backup method of birth control for 7 days after IUD insertion. Ask your health care provider whether this applies to you.  Continue to check that the IUD is still in place by feeling for the string(s) after every menstrual period, or once a month.  Take over-the-counter and prescription medicines only as told by your health care provider.  Do not drive or use heavy machinery while taking prescription pain medicine.  Keep all follow-up visits as told by your health care provider. This is important. Contact a health care provider if:  You have bleeding that is heavier or lasts longer than  a normal menstrual cycle.  You have a fever.  You have cramps or abdominal pain that get worse or do not get better with medicine.  You develop abdominal pain that is new or is not in the same area of earlier cramping and pain.  You feel lightheaded or weak.  You have abnormal or bad-smelling discharge from your vagina.  You have pain during sexual activity.  You have any of the following problems with your IUD string(s): ? The string bothers or hurts you or your sexual partner. ? You cannot feel the string. ? The string has gotten longer.  You can feel the IUD in your vagina.  You think you may be pregnant, or you miss your menstrual period.  You think you may have an STI (sexually transmitted infection). Get help right away if:  You have flu-like symptoms.  You have a fever and chills.  You can feel that your IUD has slipped out of place. Summary  After the procedure, it is common to have cramps and pain in the abdomen. It is also common to have light bleeding (spotting) or heavier bleeding that is like your menstrual period.  Continue to check that the IUD is still in place by feeling for the string(s) after every menstrual period, or once a month.  Keep all follow-up visits as told by your health care provider. This is important.  Contact your health care provider if   you have problems with your IUD string(s), such as the string getting longer or bothering you or your sexual partner. This information is not intended to replace advice given to you by your health care provider. Make sure you discuss any questions you have with your health care provider. Document Released: 11/17/2010 Document Revised: 02/10/2016 Document Reviewed: 02/10/2016 Elsevier Interactive Patient Education  2017 [employer]  Levonorgestrel intrauterine device (IUD) What is this medicine? LEVONORGESTREL IUD (LEE voe nor jes trel) is a contraceptive (birth control) device. The device is placed  inside the uterus by a healthcare professional. It is used to prevent pregnancy. This device can also be used to treat heavy bleeding that occurs during your period. This medicine may be used for other purposes; ask your health care provider or pharmacist if you have questions. COMMON BRAND NAME(S): Kyleena, LILETTA, Mirena, Skyla What should I tell my health care provider before I take this medicine? They need to know if you have any of these conditions: -abnormal Pap smear -cancer of the breast, uterus, or cervix -diabetes -endometritis -genital or pelvic infection now or in the past -have more than one sexual partner or your partner has more than one partner -heart disease -history of an ectopic or tubal pregnancy -immune system problems -IUD in place -liver disease or tumor -problems with blood clots or take blood-thinners -seizures -use intravenous drugs -uterus of unusual shape -vaginal bleeding that has not been explained -an unusual or allergic reaction to levonorgestrel, other hormones, silicone, or polyethylene, medicines, foods, dyes, or preservatives -pregnant or trying to get pregnant -breast-feeding How should I use this medicine? This device is placed inside the uterus by a health care professional. Talk to your pediatrician regarding the use of this medicine in children. Special care may be needed. Overdosage: If you think you have taken too much of this medicine contact a poison control center or emergency room at once. NOTE: This medicine is only for you. Do not share this medicine with others. What if I miss a dose? This does not apply. Depending on the brand of device you have inserted, the device will need to be replaced every 3 to 5 years if you wish to continue using this type of birth control. What may interact with this medicine? Do not take this medicine with any of the following medications: -amprenavir -bosentan -fosamprenavir This medicine may also  interact with the following medications: -aprepitant -armodafinil -barbiturate medicines for inducing sleep or treating seizures -bexarotene -boceprevir -griseofulvin -medicines to treat seizures like carbamazepine, ethotoin, felbamate, oxcarbazepine, phenytoin, topiramate -modafinil -pioglitazone -rifabutin -rifampin -rifapentine -some medicines to treat HIV infection like atazanavir, efavirenz, indinavir, lopinavir, nelfinavir, tipranavir, ritonavir -St. John's wort -warfarin This list may not describe all possible interactions. Give your health care provider a list of all the medicines, herbs, non-prescription drugs, or dietary supplements you use. Also tell them if you smoke, drink alcohol, or use illegal drugs. Some items may interact with your medicine. What should I watch for while using this medicine? Visit your doctor or health care professional for regular check ups. See your doctor if you or your partner has sexual contact with others, becomes HIV positive, or gets a sexual transmitted disease. This product does not protect you against HIV infection (AIDS) or other sexually transmitted diseases. You can check the placement of the IUD yourself by reaching up to the top of your vagina with clean fingers to feel the threads. Do not pull on the threads. It is a good habit   to check placement after each menstrual period. Call your doctor right away if you feel more of the IUD than just the threads or if you cannot feel the threads at all. The IUD may come out by itself. You may become pregnant if the device comes out. If you notice that the IUD has come out use a backup birth control method like condoms and call your health care provider. Using tampons will not change the position of the IUD and are okay to use during your period. This IUD can be safely scanned with magnetic resonance imaging (MRI) only under specific conditions. Before you have an MRI, tell your healthcare provider that  you have an IUD in place, and which type of IUD you have in place. What side effects may I notice from receiving this medicine? Side effects that you should report to your doctor or health care professional as soon as possible: -allergic reactions like skin rash, itching or hives, swelling of the face, lips, or tongue -fever, flu-like symptoms -genital sores -high blood pressure -no menstrual period for 6 weeks during use -pain, swelling, warmth in the leg -pelvic pain or tenderness -severe or sudden headache -signs of pregnancy -stomach cramping -sudden shortness of breath -trouble with balance, talking, or walking -unusual vaginal bleeding, discharge -yellowing of the eyes or skin Side effects that usually do not require medical attention (report to your doctor or health care professional if they continue or are bothersome): -acne -breast pain -change in sex drive or performance -changes in weight -cramping, dizziness, or faintness while the device is being inserted -headache -irregular menstrual bleeding within first 3 to 6 months of use -nausea This list may not describe all possible side effects. Call your doctor for medical advice about side effects. You may report side effects to FDA at 1-800-FDA-1088. Where should I keep my medicine? This does not apply. NOTE: This sheet is a summary. It may not cover all possible information. If you have questions about this medicine, talk to your doctor, pharmacist, or health care provider.  2018 Elsevier/Gold Standard (2016-01-01 14:14:56)   

## 2017-05-25 ENCOUNTER — Ambulatory Visit: Payer: Medicaid Other | Admitting: Women's Health

## 2017-05-29 ENCOUNTER — Encounter: Payer: Self-pay | Admitting: Women's Health

## 2017-05-29 ENCOUNTER — Ambulatory Visit: Payer: Medicaid Other | Admitting: Women's Health

## 2017-10-03 ENCOUNTER — Emergency Department (HOSPITAL_COMMUNITY): Payer: Medicaid Other

## 2017-10-03 ENCOUNTER — Emergency Department (HOSPITAL_COMMUNITY)
Admission: EM | Admit: 2017-10-03 | Discharge: 2017-10-03 | Disposition: A | Discharge status: Home / Self Care | Payer: Medicaid Other | Attending: Emergency Medicine | Admitting: Emergency Medicine

## 2017-10-03 ENCOUNTER — Other Ambulatory Visit: Payer: Self-pay

## 2017-10-03 ENCOUNTER — Encounter (HOSPITAL_COMMUNITY): Payer: Self-pay | Admitting: Emergency Medicine

## 2017-10-03 DIAGNOSIS — R140 Abdominal distension (gaseous): Secondary | ICD-10-CM

## 2017-10-03 DIAGNOSIS — R1030 Lower abdominal pain, unspecified: Secondary | ICD-10-CM | POA: Diagnosis not present

## 2017-10-03 DIAGNOSIS — R109 Unspecified abdominal pain: Secondary | ICD-10-CM | POA: Diagnosis not present

## 2017-10-03 DIAGNOSIS — R1033 Periumbilical pain: Secondary | ICD-10-CM | POA: Diagnosis present

## 2017-10-03 LAB — URINALYSIS, ROUTINE W REFLEX MICROSCOPIC
Bilirubin Urine: NEGATIVE
Glucose, UA: NEGATIVE mg/dL
Hgb urine dipstick: NEGATIVE
Ketones, ur: NEGATIVE mg/dL
Leukocytes, UA: NEGATIVE
Nitrite: NEGATIVE
Protein, ur: NEGATIVE mg/dL
Specific Gravity, Urine: 1.015 (ref 1.005–1.030)
pH: 8 (ref 5.0–8.0)

## 2017-10-03 LAB — CBC WITH DIFFERENTIAL/PLATELET
Basophils Absolute: 0 10*3/uL (ref 0.0–0.1)
Basophils Relative: 0 %
Eosinophils Absolute: 0.1 10*3/uL (ref 0.0–0.7)
Eosinophils Relative: 1 %
HCT: 40.9 % (ref 36.0–46.0)
Hemoglobin: 13.6 g/dL (ref 12.0–15.0)
Lymphocytes Relative: 40 %
Lymphs Abs: 2.5 10*3/uL (ref 0.7–4.0)
MCH: 31 pg (ref 26.0–34.0)
MCHC: 33.3 g/dL (ref 30.0–36.0)
MCV: 93.2 fL (ref 78.0–100.0)
Monocytes Absolute: 0.5 10*3/uL (ref 0.1–1.0)
Monocytes Relative: 8 %
Neutro Abs: 3.1 10*3/uL (ref 1.7–7.7)
Neutrophils Relative %: 51 %
Platelets: 203 10*3/uL (ref 150–400)
RBC: 4.39 MIL/uL (ref 3.87–5.11)
RDW: 11.7 % (ref 11.5–15.5)
WBC: 6.3 10*3/uL (ref 4.0–10.5)

## 2017-10-03 LAB — COMPREHENSIVE METABOLIC PANEL
ALT: 15 U/L (ref 0–44)
AST: 17 U/L (ref 15–41)
Albumin: 4 g/dL (ref 3.5–5.0)
Alkaline Phosphatase: 68 U/L (ref 38–126)
Anion gap: 6 (ref 5–15)
BUN: 9 mg/dL (ref 6–20)
CO2: 25 mmol/L (ref 22–32)
Calcium: 8.7 mg/dL — ABNORMAL LOW (ref 8.9–10.3)
Chloride: 108 mmol/L (ref 98–111)
Creatinine, Ser: 0.47 mg/dL (ref 0.44–1.00)
GFR calc Af Amer: >60 mL/min (ref >60)
GFR calc non Af Amer: >60 mL/min (ref >60)
Glucose, Bld: 93 mg/dL (ref 70–99)
Potassium: 4.1 mmol/L (ref 3.5–5.1)
Sodium: 139 mmol/L (ref 135–145)
Total Bilirubin: 1.6 mg/dL — ABNORMAL HIGH (ref 0.3–1.2)
Total Protein: 6.9 g/dL (ref 6.5–8.1)

## 2017-10-03 LAB — LIPASE, BLOOD: Lipase: 28 U/L (ref 11–51)

## 2017-10-03 LAB — POC URINE PREG, ED: Preg Test, Ur: NEGATIVE

## 2017-10-03 MED ORDER — MAGNESIUM HYDROXIDE 400 MG/5ML PO SUSP
30.0000 mL | Freq: Once | ORAL | Status: AC
  Administered 2017-10-03: 30 mL via ORAL
  Filled 2017-10-03: qty 30

## 2017-10-03 MED ORDER — IOPAMIDOL (ISOVUE-300) INJECTION 61%
100.0000 mL | Freq: Once | INTRAVENOUS | Status: AC | PRN
  Administered 2017-10-03: 100 mL via INTRAVENOUS

## 2017-10-03 NOTE — Discharge Instructions (Addendum)
Your lab tests, urine test, and CT scan are all negative for acute problem.  Your scan shows some increase in stool burden and gas.  Please use the stool softener of your choice, use a laxative if needed.  Please increase your foods that are high in fiber and bran.  Please increase leafy green vegetables.

## 2017-10-03 NOTE — ED Provider Notes (Signed)
[facility] EMERGENCY DEPARTMENT Provider Note   CSN: [id number] Arrival date & time: 10/03/17  1018     History   Chief Complaint Chief Complaint  Patient presents with  . Abdominal Pain    HPI Caitlin Higgins is a 32 y.o. female.  Patient is a 32 year old female who presents to the emergency department with complaint of abdominal pain.  Patient states that over the last week she has been having problems with feeling bloated.  She says that she feels as though something is moving in her stomach from time to time.  She has not had any unusual diarrhea, or constipation.  She has not had injury to her abdomen.  No recent surgery or procedure on the colon or abdomen other abdomen areas.  She has not noticed any blood in her stool, or blood in her urine.  She has had some nausea, she is also been doing some belching recently.  She presents now because the problem seems to be getting more more intense and it is frightening her.     Past Medical History:  Diagnosis Date  . Cervical high risk HPV (human papillomavirus) test positive 01/24/2017   Repeat pap in 1 year  . PCOS (polycystic ovarian syndrome)     Patient Active Problem List   Diagnosis Date Noted  . Encounter for insertion of mirena IUD 04/28/2017  . Cervical high risk HPV (human papillomavirus) test positive 01/24/2017  . Hematuria 01/19/2017  . Urinary tract infection with hematuria 01/19/2017    Past Surgical History:  Procedure Laterality Date  . BUTTOCK LIFT Bilateral 03/21/18     OB History    Gravida  2   Para  2   Term  2   Preterm      AB      Living  2     SAB      TAB      Ectopic      Multiple      Live Births  2            Home Medications    Prior to Admission medications   Not on File    Family History Family History  Problem Relation Age of Onset  . Breast cancer Maternal Grandmother   . High Cholesterol Mother   . Autism Daughter   . Blindness Daughter      Social History Social History   Tobacco Use  . Smoking status: Never Smoker  . Smokeless tobacco: Never Used  Substance Use Topics  . Alcohol use: No  . Drug use: No     Allergies   Patient has no known allergies.   Review of Systems Review of Systems  Constitutional: Negative for activity change.       All ROS Neg except as noted in HPI  HENT: Negative for nosebleeds.   Eyes: Negative for photophobia and discharge.  Respiratory: Negative for cough, shortness of breath and wheezing.   Cardiovascular: Negative for chest pain and palpitations.  Gastrointestinal: Positive for abdominal pain. Negative for blood in stool and vomiting.  Genitourinary: Negative for dysuria, frequency and hematuria.  Musculoskeletal: Negative for arthralgias, back pain and neck pain.  Skin: Negative.   Neurological: Negative for dizziness, seizures and speech difficulty.  Psychiatric/Behavioral: Negative for confusion and hallucinations.     Physical Exam Updated Vital Signs BP 117/73 (BP Location: Left Arm)   Pulse 61   Temp 98.2 F (36.8 C) (Oral)   Resp 12  LMP  (LMP Unknown) Comment: neg u preg 10/03/17  SpO2 100%   Physical Exam  Constitutional: She is oriented to person, place, and time. She appears well-developed and well-nourished.  Non-toxic appearance.  HENT:  Head: Normocephalic.  Right Ear: Tympanic membrane and external ear normal.  Left Ear: Tympanic membrane and external ear normal.  Eyes: Pupils are equal, round, and reactive to light. EOM and lids are normal.  Neck: Normal range of motion. Neck supple. Carotid bruit is not present.  Cardiovascular: Normal rate, regular rhythm, normal heart sounds, intact distal pulses and normal pulses.  Pulmonary/Chest: Breath sounds normal. No respiratory distress.  Abdominal: Soft. Bowel sounds are normal. There is no splenomegaly or hepatomegaly. There is tenderness in the periumbilical area. There is no rigidity, no rebound, no  guarding and no CVA tenderness.  Musculoskeletal: Normal range of motion.  Lymphadenopathy:       Head (right side): No submandibular adenopathy present.       Head (left side): No submandibular adenopathy present.    She has no cervical adenopathy.  Neurological: She is alert and oriented to person, place, and time. She has normal strength. No cranial nerve deficit or sensory deficit.  Skin: Skin is warm and dry.  Psychiatric: She has a normal mood and affect. Her speech is normal.  Nursing note and vitals reviewed.    ED Treatments / Results  Labs (all labs ordered are listed, but only abnormal results are displayed) Labs Reviewed  COMPREHENSIVE METABOLIC PANEL - Abnormal; Notable for the following components:      Result Value   Calcium 8.7 (*)    Total Bilirubin 1.6 (*)    All other components within normal limits  URINALYSIS, ROUTINE W REFLEX MICROSCOPIC  CBC WITH DIFFERENTIAL/PLATELET  LIPASE, BLOOD  POC URINE PREG, ED    EKG None  Radiology Ct Abdomen Pelvis W Contrast  Result Date: 10/03/2017 CLINICAL DATA:  Periumbilical and suprapubic pain with bloating for 1 week. EXAM: CT ABDOMEN AND PELVIS WITH CONTRAST TECHNIQUE: Multidetector CT imaging of the abdomen and pelvis was performed using the standard protocol following bolus administration of intravenous contrast. CONTRAST:  100mL ISOVUE-300 IOPAMIDOL (ISOVUE-300) INJECTION 61% COMPARISON:  None. FINDINGS: Lower chest: Clear lung bases. Hepatobiliary: No focal liver abnormality is seen. No gallstones, gallbladder wall thickening, or biliary dilatation. Pancreas: Unremarkable. Spleen: Unremarkable. Adrenals/Urinary Tract: Unremarkable adrenal glands. Excreted contrast in the renal collecting systems, ureters, and bladder, limiting assessment for urolithiasis. No hydronephrosis or renal mass. Normal bladder wall thickness. Stomach/Bowel: The stomach is within normal limits. No bowel dilatation or gross bowel wall thickening is  identified. The appendix is unremarkable. Vascular/Lymphatic: No significant vascular findings are present. No enlarged abdominal or pelvic lymph nodes. Reproductive: An IUD is in place. The ovaries are grossly unremarkable. Other: No intraperitoneal free fluid. Suspected postoperative changes in the subcutaneous tissues of the gluteal regions, flanks, and lower anterior abdominal wall. No abdominal wall hernia. Musculoskeletal: No acute osseous abnormality or suspicious osseous lesion. IMPRESSION: No acute abnormality identified in the abdomen or pelvis. Electronically Signed   By: Sebastian AcheAllen  Grady M.D.   On: 10/03/2017 14:44    Procedures Procedures (including critical care time)  Medications Ordered in ED Medications  magnesium hydroxide (MILK OF MAGNESIA) suspension 30 mL (has no administration in time range)  iopamidol (ISOVUE-300) 61 % injection 100 mL (100 mLs Intravenous Contrast Given 10/03/17 1353)     Initial Impression / Assessment and Plan / ED Course  I have reviewed the  triage vital signs and the nursing notes.  Pertinent labs & imaging results that were available during my care of the patient were reviewed by me and considered in my medical decision making (see chart for details).       Final Clinical Impressions(s) / ED Diagnoses vital signs are within normal limits.  Pulse oximetry is 100% on room air. MDM  Within normal limits by my interpretation.  Patient has a sensation of bloating, and belching.  She has nausea present, but no actual vomiting.  The problem is getting worse.  The complete blood count is well within normal limits.  Lipase is normal.  Comprehensive metabolic panel shows the total bilirubin to be slightly elevated at 1.6, otherwise the conference of metabolic panel is within normal limits.  The anion gap is 6.  Urine pregnancy is negative. Urine analysis is within normal limits.  The CT scan of the abdomen and pelvis of the is negative for acute changes or  problems.  There is some increase stool present within the colon and increased gas present throughout the colon.  I suspect the bloating is probably related to the increased stool burden and gas buildup.  I have asked the patient to increase the bran and fiber in her diet.  Increase fluids.  She will use stool softener if needed.  Patient is to discuss this with her primary physician.  She is to return to the emergency department if any changes in condition, problems, or concerns.   Final diagnoses:  Bloating    ED Discharge Orders    None       Ivery Quale, PA-C 10/03/17 1522    Mancel Bale, MD 10/03/17 [id number]

## 2017-10-03 NOTE — ED Triage Notes (Addendum)
Pt c/o feeling bloated x 1 week. sattes getting worse. Took mag citrate but not feeling better. Denies v/d. Some nausea and belching

## 2018-10-23 ENCOUNTER — Encounter: Payer: Self-pay | Admitting: Family Medicine

## 2018-10-23 ENCOUNTER — Ambulatory Visit (INDEPENDENT_AMBULATORY_CARE_PROVIDER_SITE_OTHER): Payer: Medicaid Other | Admitting: Family Medicine

## 2018-10-23 ENCOUNTER — Other Ambulatory Visit: Payer: Self-pay

## 2018-10-23 VITALS — BP 106/69 | HR 67 | Temp 98.0°F | Resp 12 | Ht 66.0 in | Wt 171.1 lb

## 2018-10-23 DIAGNOSIS — Z6379 Other stressful life events affecting family and household: Secondary | ICD-10-CM | POA: Diagnosis not present

## 2018-10-23 DIAGNOSIS — Z638 Other specified problems related to primary support group: Secondary | ICD-10-CM | POA: Diagnosis not present

## 2018-10-23 DIAGNOSIS — E663 Overweight: Secondary | ICD-10-CM

## 2018-10-23 DIAGNOSIS — M545 Low back pain, unspecified: Secondary | ICD-10-CM

## 2018-10-23 DIAGNOSIS — F419 Anxiety disorder, unspecified: Secondary | ICD-10-CM | POA: Diagnosis not present

## 2018-10-23 MED ORDER — BUSPIRONE HCL 5 MG PO TABS: 5.0000 mg | ORAL_TABLET | Freq: Three times a day (TID) | ORAL | 1 refills | Status: DC

## 2018-10-23 NOTE — Patient Instructions (Addendum)
Thank you for coming into the office today. I appreciate the opportunity to provide you with the care for your health and wellness. Today we discussed:   Follow Up: 3 months for anxiety   No labs   Start working out: walk 30-60 minutes most days of the week.  Stretch back out. Watch yoga videos: Yoga with Hansel Starlingdrienne  And you can goggle, back stretches.  Increase walk intake drink 6-8 cups water daily. Make sure you get veggies fruits to be about 75% ofyour meal.  Call [facility] and ask about the cramping and spotting  Please continue to practice social distancing to keep you, your family, and our community safe.  If you must go out, please wear a Mask and practice good handwashing.  WASH YOUR HANDS WELL AND FREQUENTLY. AVOID TOUCHING YOUR FACE, UNLESS YOUR HANDS ARE FRESHLY WASHED.  GET FRESH AIR DAILY. STAY HYDRATED WITH WATER.   It was a pleasure to see you and I look forward to continuing to work together on your health and well-being. Please do not hesitate to call the office if you need care or have questions about your care.  Have a wonderful day and week. With Gratitude, Tereasa CoopHannah Mills, DNP, AGNP-BC    Back Exercises These exercises help to make your trunk and back strong. They also help to keep the lower back flexible. Doing these exercises can help to prevent back pain or lessen existing pain.  If you have back pain, try to do these exercises 2-3 times each day or as told by your doctor.  As you get better, do the exercises once each day. Repeat the exercises more often as told by your doctor.  To stop back pain from coming back, do the exercises once each day, or as told by your doctor. Exercises Single knee to chest Do these steps 3-5 times in a row for each leg: 1. Lie on your back on a firm bed or the floor with your legs stretched out. 2. Bring one knee to your chest. 3. Grab your knee or thigh with both hands and hold them it in place. 4.  Pull on your knee until you feel a gentle stretch in your lower back or buttocks. 5. Keep doing the stretch for 10-30 seconds. 6. Slowly let go of your leg and straighten it. Pelvic tilt Do these steps 5-10 times in a row: 1. Lie on your back on a firm bed or the floor with your legs stretched out. 2. Bend your knees so they point up to the ceiling. Your feet should be flat on the floor. 3. Tighten your lower belly (abdomen) muscles to press your lower back against the floor. This will make your tailbone point up to the ceiling instead of pointing down to your feet or the floor. 4. Stay in this position for 5-10 seconds while you gently tighten your muscles and breathe evenly. Cat-cow Do these steps until your lower back bends more easily: 1. Get on your hands and knees on a firm surface. Keep your hands under your shoulders, and keep your knees under your hips. You may put padding under your knees. 2. Let your head hang down toward your chest. Tighten (contract) the muscles in your belly. Point your tailbone toward the floor so your lower back becomes rounded like the back of a cat. 3. Stay in this position for 5 seconds. 4. Slowly lift your head. Let the muscles of your belly relax. Point your tailbone  up toward the ceiling so your back forms a sagging arch like the back of a cow. 5. Stay in this position for 5 seconds.  Press-ups Do these steps 5-10 times in a row: 1. Lie on your belly (face-down) on the floor. 2. Place your hands near your head, about shoulder-width apart. 3. While you keep your back relaxed and keep your hips on the floor, slowly straighten your arms to raise the top half of your body and lift your shoulders. Do not use your back muscles. You may change where you place your hands in order to make yourself more comfortable. 4. Stay in this position for 5 seconds. 5. Slowly return to lying flat on the floor.  Bridges Do these steps 10 times in a row: 1. Lie on your back  on a firm surface. 2. Bend your knees so they point up to the ceiling. Your feet should be flat on the floor. Your arms should be flat at your sides, next to your body. 3. Tighten your butt muscles and lift your butt off the floor until your waist is almost as high as your knees. If you do not feel the muscles working in your butt and the back of your thighs, slide your feet 1-2 inches farther away from your butt. 4. Stay in this position for 3-5 seconds. 5. Slowly lower your butt to the floor, and let your butt muscles relax. If this exercise is too easy, try doing it with your arms crossed over your chest. Belly crunches Do these steps 5-10 times in a row: 1. Lie on your back on a firm bed or the floor with your legs stretched out. 2. Bend your knees so they point up to the ceiling. Your feet should be flat on the floor. 3. Cross your arms over your chest. 4. Tip your chin a little bit toward your chest but do not bend your neck. 5. Tighten your belly muscles and slowly raise your chest just enough to lift your shoulder blades a tiny bit off of the floor. Avoid raising your body higher than that, because it can put too much stress on your low back. 6. Slowly lower your chest and your head to the floor. Back lifts Do these steps 5-10 times in a row: 1. Lie on your belly (face-down) with your arms at your sides, and rest your forehead on the floor. 2. Tighten the muscles in your legs and your butt. 3. Slowly lift your chest off of the floor while you keep your hips on the floor. Keep the back of your head in line with the curve in your back. Look at the floor while you do this. 4. Stay in this position for 3-5 seconds. 5. Slowly lower your chest and your face to the floor. Contact a doctor if:  Your back pain gets a lot worse when you do an exercise.  Your back pain does not get better 2 hours after you exercise. If you have any of these problems, stop doing the exercises. Do not do them  again unless your doctor says it is okay. Get help right away if:  You have sudden, very bad back pain. If this happens, stop doing the exercises. Do not do them again unless your doctor says it is okay. This information is not intended to replace advice given to you by your health care provider. Make sure you discuss any questions you have with your health care provider. Document Released: 04/23/2010 Document Revised: 12/14/2017  Document Reviewed: 12/14/2017 Elsevier Patient Education  2020 [employer]   Living With Anxiety  After being diagnosed with an anxiety disorder, you may be relieved to know why you have felt or behaved a certain way. It is natural to also feel overwhelmed about the treatment ahead and what it will mean for your life. With care and support, you can manage this condition and recover from it. How to cope with anxiety Dealing with stress Stress is your body's reaction to life changes and events, both good and bad. Stress can last just a few hours or it can be ongoing. Stress can play a major role in anxiety, so it is important to learn both how to cope with stress and how to think about it differently. Talk with your health care provider or a counselor to learn more about stress reduction. He or she may suggest some stress reduction techniques, such as:  Music therapy. This can include creating or listening to music that you enjoy and that inspires you.  Mindfulness-based meditation. This involves being aware of your normal breaths, rather than trying to control your breathing. It can be done while sitting or walking.  Centering prayer. This is a kind of meditation that involves focusing on a word, phrase, or sacred image that is meaningful to you and that brings you peace.  Deep breathing. To do this, expand your stomach and inhale slowly through your nose. Hold your breath for 3-5 seconds. Then exhale slowly, allowing your stomach muscles to relax.  Self-talk.  This is a skill where you identify thought patterns that lead to anxiety reactions and correct those thoughts.  Muscle relaxation. This involves tensing muscles then relaxing them. Choose a stress reduction technique that fits your lifestyle and personality. Stress reduction techniques take time and practice. Set aside 5-15 minutes a day to do them. Therapists can offer training in these techniques. The training may be covered by some insurance plans. Other things you can do to manage stress include:  Keeping a stress diary. This can help you learn what triggers your stress and ways to control your response.  Thinking about how you respond to certain situations. You may not be able to control everything, but you can control your reaction.  Making time for activities that help you relax, and not feeling guilty about spending your time in this way. Therapy combined with coping and stress-reduction skills provides the best chance for successful treatment. Medicines Medicines can help ease symptoms. Medicines for anxiety include:  Anti-anxiety drugs.  Antidepressants.  Beta-blockers. Medicines may be used as the main treatment for anxiety disorder, along with therapy, or if other treatments are not working. Medicines should be prescribed by a health care provider. Relationships Relationships can play a big part in helping you recover. Try to spend more time connecting with trusted friends and family members. Consider going to couples counseling, taking family education classes, or going to family therapy. Therapy can help you and others better understand the condition. How to recognize changes in your condition Everyone has a different response to treatment for anxiety. Recovery from anxiety happens when symptoms decrease and stop interfering with your daily activities at home or work. This may mean that you will start to:  Have better concentration and focus.  Sleep better.  Be less  irritable.  Have more energy.  Have improved memory. It is important to recognize when your condition is getting worse. Contact your health care provider if your symptoms interfere with home  or work and you do not feel like your condition is improving. Where to find help and support: You can get help and support from these sources:  Self-help groups.  Online and [employer].  A trusted spiritual leader.  Couples counseling.  Family education classes.  Family therapy. Follow these instructions at home:  Eat a healthy diet that includes plenty of vegetables, fruits, whole grains, low-fat dairy products, and lean protein. Do not eat a lot of foods that are high in solid fats, added sugars, or salt.  Exercise. Most adults should do the following: ? Exercise for at least 150 minutes each week. The exercise should increase your heart rate and make you sweat (moderate-intensity exercise). ? Strengthening exercises at least twice a week.  Cut down on caffeine, tobacco, alcohol, and other potentially harmful substances.  Get the right amount and quality of sleep. Most adults need 7-9 hours of sleep each night.  Make choices that simplify your life.  Take over-the-counter and prescription medicines only as told by your health care provider.  Avoid caffeine, alcohol, and certain over-the-counter cold medicines. These may make you feel worse. Ask your pharmacist which medicines to avoid.  Keep all follow-up visits as told by your health care provider. This is important. Questions to ask your health care provider  Would I benefit from therapy?  How often should I follow up with a health care provider?  How long do I need to take medicine?  Are there any long-term side effects of my medicine?  Are there any alternatives to taking medicine? Contact a health care provider if:  You have a hard time staying focused or finishing daily tasks.  You spend many hours a day  feeling worried about everyday life.  You become exhausted by worry.  You start to have headaches, feel tense, or have nausea.  You urinate more than normal.  You have diarrhea. Get help right away if:  You have a racing heart and shortness of breath.  You have thoughts of hurting yourself or others. If you ever feel like you may hurt yourself or others, or have thoughts about taking your own life, get help right away. You can go to your nearest emergency department or call:  Your local emergency services (911 in the [place]  A suicide crisis helpline, such as the [facility] at 951-151-0192. This is open 24-hours a day. Summary  Taking steps to deal with stress can help calm you.  Medicines cannot cure anxiety disorders, but they can help ease symptoms.  Family, friends, and partners can play a big part in helping you recover from an anxiety disorder. This information is not intended to replace advice given to you by your health care provider. Make sure you discuss any questions you have with your health care provider. Document Released: 03/15/2016 Document Revised: 03/03/2017 Document Reviewed: 03/15/2016 Elsevier Patient Education  2020 [employer].
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
## 2018-10-24 ENCOUNTER — Encounter: Payer: Self-pay | Admitting: Family Medicine

## 2018-11-16 ENCOUNTER — Other Ambulatory Visit: Payer: Self-pay

## 2018-11-16 ENCOUNTER — Encounter: Payer: Self-pay | Admitting: Family Medicine

## 2018-11-16 ENCOUNTER — Telehealth (INDEPENDENT_AMBULATORY_CARE_PROVIDER_SITE_OTHER): Payer: Medicaid Other | Admitting: Family Medicine

## 2018-11-16 VITALS — Ht 66.0 in | Wt 170.0 lb

## 2018-11-16 DIAGNOSIS — G43809 Other migraine, not intractable, without status migrainosus: Secondary | ICD-10-CM | POA: Diagnosis not present

## 2018-11-16 DIAGNOSIS — Z6379 Other stressful life events affecting family and household: Secondary | ICD-10-CM | POA: Diagnosis not present

## 2018-11-16 MED ORDER — BUTALBITAL-APAP-CAFFEINE 50-325-40 MG PO TABS
1.0000 | ORAL_TABLET | Freq: Three times a day (TID) | ORAL | 0 refills | Status: AC | PRN
Start: 2018-11-16 — End: 2019-11-16

## 2018-11-16 MED ORDER — RIZATRIPTAN BENZOATE 5 MG PO TABS
5.0000 mg | ORAL_TABLET | ORAL | 0 refills | Status: DC | PRN
Start: 2018-11-16 — End: 2019-03-25

## 2018-11-16 NOTE — Patient Instructions (Addendum)
    Thank you for completing your visit on Mychart Video. I appreciate the opportunity to provide you with the care for your health and wellness. Today we discussed: migraine  Follow Up: as scheduled 01/23/2019  No labs or referrals today.  I have sent in the medications se discussed. Take as directed on bottles.  If you have any questions or reactions please call the office.  Continue to work on self care and you during this stressful time.  REMEMBER:  Working out: walk 30-60 minutes most days of the week.  Stretch back out. Watch yoga videos: Yoga with Vincente Liberty  And you can goggle, back stretches. And watch videos to help with anxiety and stress reduction.  Please continue to practice social distancing to keep you, your family, and our community safe.  If you must go out, please wear a Mask and practice good handwashing.  [place] YOUR HANDS WELL AND FREQUENTLY. AVOID TOUCHING YOUR FACE, UNLESS YOUR HANDS ARE FRESHLY WASHED.  GET FRESH AIR DAILY. STAY HYDRATED WITH WATER.   It was a pleasure to see you and I look forward to continuing to work together on your health and well-being. Please do not hesitate to call the office if you need care or have questions about your care.  Have a wonderful day and week. With Gratitude, Cherly Beach, DNP, AGNP-BC
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
## 2019-01-09 ENCOUNTER — Other Ambulatory Visit: Payer: Self-pay

## 2019-01-09 ENCOUNTER — Other Ambulatory Visit (HOSPITAL_COMMUNITY)
Admission: RE | Admit: 2019-01-09 | Discharge: 2019-01-09 | Disposition: A | Discharge status: Home / Self Care | Payer: Medicaid Other | Source: Ambulatory Visit | Attending: Adult Health | Admitting: Adult Health

## 2019-01-09 ENCOUNTER — Encounter: Payer: Self-pay | Admitting: Adult Health

## 2019-01-09 ENCOUNTER — Ambulatory Visit: Payer: Medicaid Other | Admitting: Adult Health

## 2019-01-09 VITALS — BP 102/64 | HR 65 | Ht 66.0 in | Wt 167.4 lb

## 2019-01-09 DIAGNOSIS — Z113 Encounter for screening for infections with a predominantly sexual mode of transmission: Secondary | ICD-10-CM

## 2019-01-09 DIAGNOSIS — Z0000 Encounter for general adult medical examination without abnormal findings: Secondary | ICD-10-CM | POA: Diagnosis not present

## 2019-01-09 DIAGNOSIS — Z01419 Encounter for gynecological examination (general) (routine) without abnormal findings: Secondary | ICD-10-CM

## 2019-01-09 DIAGNOSIS — Z975 Presence of (intrauterine) contraceptive device: Secondary | ICD-10-CM

## 2019-01-09 HISTORY — DX: Encounter for gynecological examination (general) (routine) without abnormal findings: Z01.419

## 2019-01-09 HISTORY — DX: Presence of (intrauterine) contraceptive device: Z97.5

## 2019-01-09 NOTE — Addendum Note (Signed)
Addended by: Diona Fanti A on: 01/09/2019 04:01 PM   Modules accepted: Orders

## 2019-01-09 NOTE — Progress Notes (Addendum)
Patient ID: AMBERIA BAYLESS, female   DOB: 06-11-1985, 33 y.o.   MRN: [id number] History of Present Illness: Caitlin Higgins is a 33 year old white female, G2P2, single, in for well woman gyn exam and pap. She had a pap 01/19/17 that was negative for malignancy but +HPV. PCP is Cherly Beach NP.   Current Medications, Allergies, Past Medical History, Past Surgical History, Family History and Social History were reviewed in [employer] record.     Review of Systems: Patient denies any daily headaches(has migraines and has meds), hearing loss, fatigue, blurred vision, shortness of breath, chest pain, abdominal pain, problems with bowel movements, urination, or intercourse. No joint pain or mood swings. =spotting for 2 months on and off with IUD and RLQ pain at times, like when has period, but does not have period with IUD   Physical Exam:BP 102/64 (BP Location: Right Arm, Patient Position: Sitting, Cuff Size: Normal)   Pulse 65   Ht 5\' 6"  (1.676 m)   Wt 167 lb 6.4 oz (75.9 kg)   BMI 27.02 kg/m  General:  Well developed, well nourished, no acute distress Skin:  Warm and dry Neck:  Midline trachea, normal thyroid, good ROM, no lymphadenopathy Lungs; Clear to auscultation bilaterally Breast:  No dominant palpable mass, retraction, or nipple discharge Cardiovascular: Regular rate and rhythm Abdomen:  Soft, non tender, no hepatosplenomegaly Pelvic:  External genitalia is normal in appearance, no lesions.  The vagina is normal in appearance. Urethra has no lesions or masses. The cervix is bulbous. +IUD strings, Pap with GC/CHL and high risk HPV with 16/18 genotyping. Uterus is felt to be normal size, shape, and contour.  No adnexal masses or tenderness noted.Bladder is non tender, no masses felt. Extremities/musculoskeletal:  No swelling or varicosities noted, no clubbing or cyanosis Psych:  No mood changes, alert and cooperative,seems happy Fall risk is low PHQ 9 score is 7, she I on  meds and denies being suicidal. Examination chaperoned by Diona Fanti CMA.   Impression and Plan:  1. IUD (intrauterine device) in place -placed 04/05/17  2. Encounter for routine gynecological examination with Papanicolaou smear of cervix - pap with GC/CHL, high risk HPV and 16/18 genotyping sent -physical in 1 year -Check CBC,CMP,TSH and lipids  3. Screening examination for STD (sexually transmitted disease) -check HIV and RPR

## 2019-01-10 DIAGNOSIS — Z01419 Encounter for gynecological examination (general) (routine) without abnormal findings: Secondary | ICD-10-CM | POA: Diagnosis not present

## 2019-01-10 DIAGNOSIS — Z113 Encounter for screening for infections with a predominantly sexual mode of transmission: Secondary | ICD-10-CM | POA: Diagnosis not present

## 2019-01-11 LAB — HIV ANTIBODY (ROUTINE TESTING W REFLEX): HIV Screen 4th Generation wRfx: NONREACTIVE

## 2019-01-11 LAB — CBC
Hematocrit: 39.9 % (ref 34.0–46.6)
Hemoglobin: 13.2 g/dL (ref 11.1–15.9)
MCH: 31 pg (ref 26.6–33.0)
MCHC: 33.1 g/dL (ref 31.5–35.7)
MCV: 94 fL (ref 79–97)
Platelets: 205 10*3/uL (ref 150–450)
RBC: 4.26 x10E6/uL (ref 3.77–5.28)
RDW: 11.9 % (ref 11.7–15.4)
WBC: 7.4 10*3/uL (ref 3.4–10.8)

## 2019-01-11 LAB — COMPREHENSIVE METABOLIC PANEL
ALT: 11 IU/L (ref 0–32)
AST: 15 IU/L (ref 0–40)
Albumin/Globulin Ratio: 1.9 (ref 1.2–2.2)
Albumin: 4.2 g/dL (ref 3.8–4.8)
Alkaline Phosphatase: 70 IU/L (ref 39–117)
BUN/Creatinine Ratio: 17 (ref 9–23)
BUN: 11 mg/dL (ref 6–20)
Bilirubin Total: 0.9 mg/dL (ref 0.0–1.2)
CO2: 22 mmol/L (ref 20–29)
Calcium: 9.2 mg/dL (ref 8.7–10.2)
Chloride: 106 mmol/L (ref 96–106)
Creatinine, Ser: 0.66 mg/dL (ref 0.57–1.00)
GFR calc Af Amer: 135 mL/min/{1.73_m2} (ref >59)
GFR calc non Af Amer: 117 mL/min/{1.73_m2} (ref >59)
Globulin, Total: 2.2 g/dL (ref 1.5–4.5)
Glucose: 98 mg/dL (ref 65–99)
Potassium: 4 mmol/L (ref 3.5–5.2)
Sodium: 140 mmol/L (ref 134–144)
Total Protein: 6.4 g/dL (ref 6.0–8.5)

## 2019-01-11 LAB — LIPID PANEL
Chol/HDL Ratio: 5.9 ratio — ABNORMAL HIGH (ref 0.0–4.4)
Cholesterol, Total: 170 mg/dL (ref 100–199)
HDL: 29 mg/dL — ABNORMAL LOW (ref >39)
LDL Chol Calc (NIH): 129 mg/dL — ABNORMAL HIGH (ref 0–99)
Triglycerides: 61 mg/dL (ref 0–149)
VLDL Cholesterol Cal: 12 mg/dL (ref 5–40)

## 2019-01-11 LAB — RPR: RPR Ser Ql: NONREACTIVE

## 2019-01-11 LAB — TSH: TSH: 2.83 u[IU]/mL (ref 0.450–4.500)

## 2019-01-21 ENCOUNTER — Telehealth: Payer: Self-pay | Admitting: Adult Health

## 2019-01-21 ENCOUNTER — Encounter: Payer: Self-pay | Admitting: Adult Health

## 2019-01-21 DIAGNOSIS — R8789 Other abnormal findings in specimens from female genital organs: Secondary | ICD-10-CM

## 2019-01-21 DIAGNOSIS — R87611 Atypical squamous cells cannot exclude high grade squamous intraepithelial lesion on cytologic smear of cervix (ASC-H): Secondary | ICD-10-CM

## 2019-01-21 DIAGNOSIS — R87618 Other abnormal cytological findings on specimens from cervix uteri: Secondary | ICD-10-CM

## 2019-01-21 HISTORY — DX: Other abnormal cytological findings on specimens from cervix uteri: R87.618

## 2019-01-21 HISTORY — DX: Atypical squamous cells cannot exclude high grade squamous intraepithelial lesion on cytologic smear of cervix (ASC-H): R87.611

## 2019-01-21 LAB — CYTOLOGY - PAP
Chlamydia: NEGATIVE
Comment: NEGATIVE
Comment: NEGATIVE
Comment: NEGATIVE
Comment: NORMAL
Diagnosis: HIGH — AB
HPV 16: POSITIVE — AB
HPV 18 / 45: NEGATIVE
High risk HPV: POSITIVE — AB
Neisseria Gonorrhea: NEGATIVE

## 2019-01-21 NOTE — Telephone Encounter (Signed)
Pt aware that Pap showed +16, and abnormal cells, was [facility], will need to get Colpo with Dr Elonda Husky, and she has had more spotting with IUD. [facility]P says to get colpo and then 6 month follow up with HPV based testing

## 2019-01-23 ENCOUNTER — Other Ambulatory Visit: Payer: Self-pay

## 2019-01-23 ENCOUNTER — Ambulatory Visit (INDEPENDENT_AMBULATORY_CARE_PROVIDER_SITE_OTHER): Payer: Medicaid Other | Admitting: Family Medicine

## 2019-01-23 ENCOUNTER — Encounter: Payer: Self-pay | Admitting: Family Medicine

## 2019-01-23 DIAGNOSIS — R8789 Other abnormal findings in specimens from female genital organs: Secondary | ICD-10-CM | POA: Diagnosis not present

## 2019-01-23 DIAGNOSIS — F419 Anxiety disorder, unspecified: Secondary | ICD-10-CM | POA: Diagnosis not present

## 2019-01-23 DIAGNOSIS — E663 Overweight: Secondary | ICD-10-CM

## 2019-01-23 DIAGNOSIS — R87618 Other abnormal cytological findings on specimens from cervix uteri: Secondary | ICD-10-CM

## 2019-01-23 NOTE — Patient Instructions (Addendum)
  I appreciate the opportunity to provide you with the care for your health and wellness. Today we discussed: Anxiety  Follow up: First week in November by phone  No labs or referrals today  Continue to do exercise and use BuSpar as needed.  If he has any other questions or concerns regarding your upcoming procedure please do not hesitate to reach out here to help you.  We will talk to you a few days after you finish that procedure you can make this appointment by phone.  I am so proud of you for your weight loss.  Congratulations on taking control back of your life and your feelings.   Please continue to practice social distancing to keep you, your family, and our community safe.  If you must go out, please wear a mask and practice good handwashing.  It was a pleasure to see you and I look forward to continuing to work together on your health and well-being. Please do not hesitate to call the office if you need care or have questions about your care.  Have a wonderful day and week. With Gratitude, Cherly Beach, DNP, AGNP-BC
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
## 2019-02-04 ENCOUNTER — Encounter: Payer: Self-pay | Admitting: Obstetrics & Gynecology

## 2019-02-04 ENCOUNTER — Ambulatory Visit: Payer: Medicaid Other | Admitting: Obstetrics & Gynecology

## 2019-02-04 ENCOUNTER — Other Ambulatory Visit: Payer: Self-pay

## 2019-02-04 VITALS — BP 120/81 | HR 58 | Ht 66.0 in | Wt 166.0 lb

## 2019-02-04 DIAGNOSIS — B977 Papillomavirus as the cause of diseases classified elsewhere: Secondary | ICD-10-CM | POA: Diagnosis not present

## 2019-02-04 DIAGNOSIS — R87611 Atypical squamous cells cannot exclude high grade squamous intraepithelial lesion on cytologic smear of cervix (ASC-H): Secondary | ICD-10-CM

## 2019-02-04 DIAGNOSIS — Z3202 Encounter for pregnancy test, result negative: Secondary | ICD-10-CM | POA: Diagnosis not present

## 2019-02-04 LAB — POCT URINE PREGNANCY: Preg Test, Ur: NEGATIVE

## 2019-02-04 NOTE — Progress Notes (Signed)
  Colposcopy Procedure Note:  Colposcopy Procedure Note  Indications:  2020 cytology:  ASC-H +HPV16 2018 cytology:  Normal +HPV, untyped Smoker:  No. New sexual partner:  No.      Procedure Details  The risks and benefits of the procedure and Written informed consent obtained.  Speculum placed in vagina and excellent visualization of cervix achieved, cervix swabbed x 3 with acetic acid solution.  Findings: Cervix: no visible lesions, no mosaicism, no punctation and no abnormal vasculature; SCJ visualized 360 degrees without lesions and no biopsies taken. Vaginal inspection: vaginal colposcopy not performed. Vulvar colposcopy: vulvar colposcopy not performed.  Specimens: none  Complications: none.  Plan:    ICD-10-CM   1. Atypical squamous cells cannot exclude high grade squamous intraepithelial lesion on cytologic smear of cervix (ASC-H)  R87.611    colposcopy is negative, repeat HPV based testing 1 year  2. Pregnancy examination or test, negative result  Z32.02 POCT urine pregnancy  3. Positive HPV 16  B97.7     Repeat HPV based testing in 1 year

## 2019-02-07 ENCOUNTER — Telehealth (INDEPENDENT_AMBULATORY_CARE_PROVIDER_SITE_OTHER): Payer: Medicaid Other | Admitting: Family Medicine

## 2019-02-07 ENCOUNTER — Encounter: Payer: Self-pay | Admitting: Family Medicine

## 2019-02-07 ENCOUNTER — Other Ambulatory Visit: Payer: Self-pay

## 2019-02-07 DIAGNOSIS — E663 Overweight: Secondary | ICD-10-CM

## 2019-02-07 DIAGNOSIS — F419 Anxiety disorder, unspecified: Secondary | ICD-10-CM

## 2019-02-07 NOTE — Progress Notes (Signed)
Virtual Visit via Telephone Note   This visit type was conducted due to national recommendations for restrictions regarding the COVID-19 Pandemic (e.g. social distancing) in an effort to limit this patient's exposure and mitigate transmission in our community.  Due to her co-morbid illnesses, this patient is at least at moderate risk for complications without adequate follow up.  This format is felt to be most appropriate for this patient at this time.  The patient did not have access to video technology/had technical difficulties with video requiring transitioning to audio format only (telephone).  All issues noted in this document were discussed and addressed.  No physical exam could be performed with this format.   Evaluation Performed:  Follow-up visit  Date:  02/07/2019   ID:  Caitlin Higgins, DOB May 04, 1985, MRN [id number]  Patient Location: Home Provider Location: Office  Location of Patient: Home Location of Provider: Telehealth Consent was obtain for visit to be over via telehealth. I verified that I am speaking with the correct person using two identifiers.  PCP:  Perlie Mayo, NP   Chief Complaint:  anxiety  History of Present Illness:    Caitlin Higgins is a 33 y.o. female with history of anxiety, depression, recent finding of HPV with Pap. Had colposcopy first part of this week. Currently taking BuSpar only as needed for anxiety. Overall she reports feeling better and Dr Elonda Husky explained everything well for her to understand.  She has no other complaints today and reports that she thinks she is better with her anxiety as well.   The patient does not have symptoms concerning for COVID-19 infection (fever, chills, cough, or new shortness of breath).   Past Medical, Surgical, Social History, Allergies, and Medications have been Reviewed.  Past Medical History:  Diagnosis Date  . Abnormal Papanicolaou smear of cervix with positive human papilloma virus (HPV) test  01/21/2019   Pap +HPV 16 and ASCH, will get colpo  . Anxiety   . Cervical high risk HPV (human papillomavirus) test positive 01/24/2017   Repeat pap in 1 year  . Migraine   . PCOS (polycystic ovarian syndrome)    Past Surgical History:  Procedure Laterality Date  . BUTTOCK LIFT Bilateral 03/21/18     Current Meds  Medication Sig  . busPIRone (BUSPAR) 5 MG tablet Take 1 tablet (5 mg total) by mouth 3 (three) times daily. (Patient taking differently: Take 5 mg by mouth as needed. )  . butalbital-acetaminophen-caffeine (FIORICET) 50-325-40 MG tablet Take 1-2 tablets by mouth every 8 (eight) hours as needed for migraine.  Marland Kitchen levonorgestrel (MIRENA) 20 MCG/24HR IUD 1 each by Intrauterine route once.  . rizatriptan (MAXALT) 5 MG tablet Take 1 tablet (5 mg total) by mouth as needed for migraine. May repeat in 2 hours if needed     Allergies:   Patient has no known allergies.   Social History   Tobacco Use  . Smoking status: Never Smoker  . Smokeless tobacco: Never Used  Substance Use Topics  . Alcohol use: No  . Drug use: No     Family Hx: The patient's family history includes Autism in her daughter; Blindness in her daughter; Breast cancer in her maternal grandmother; High Cholesterol in her mother.  ROS:   Please see the history of present illness.    All other systems reviewed and are negative.   Labs/Other Tests and Data Reviewed:    Recent Labs: 01/10/2019: ALT 11; BUN 11; Creatinine, Ser 0.66;  Hemoglobin 13.2; Platelets 205; Potassium 4.0; Sodium 140; TSH 2.830   Recent Lipid Panel Lab Results  Component Value Date/Time   CHOL 170 01/10/2019 08:40 AM   TRIG 61 01/10/2019 08:40 AM   HDL 29 (L) 01/10/2019 08:40 AM   CHOLHDL 5.9 (H) 01/10/2019 08:40 AM   LDLCALC 129 (H) 01/10/2019 08:40 AM    Wt Readings from Last 3 Encounters:  02/04/19 166 lb (75.3 kg)  01/09/19 167 lb 6.4 oz (75.9 kg)  11/16/18 170 lb (77.1 kg)     Objective:    Vital Signs:  There were no  vitals taken for this visit.   GEN:  Alert and oriented alert and oriented RESPIRATORY:  No shortness of breath noted in conversation PSYCH:  Normal affect and mood   GAD 7 : Generalized Anxiety Score 02/07/2019 01/23/2019 10/23/2018  Nervous, Anxious, on Edge 0 1 0  Control/stop worrying 0 0 2  Worry too much - different things 0 0 2  Trouble relaxing 0 0 0  Restless 0 0 2  Easily annoyed or irritable 0 1 0  Afraid - awful might happen 0 1 1  Total GAD 7 Score 0 3 7  Anxiety Difficulty Not difficult at all Somewhat difficult Somewhat difficult      ASSESSMENT & PLAN:    1. Anxiety Improved, continue Buspar as needed. Continue self care  2. Overweight (BMI 25.0-29.9) Caitlin Higgins is re-educated about the importance of exercise daily to help with weight management. A minumum of 30 minutes daily is recommended. Additionally, importance of healthy food choices  with portion control discussed.   Wt Readings from Last 3 Encounters:  02/04/19 166 lb (75.3 kg)  01/09/19 167 lb 6.4 oz (75.9 kg)  11/16/18 170 lb (77.1 kg)      Time:   Today, I have spent 7 minutes with the patient with telehealth technology discussing the above problems.     Medication Adjustments/Labs and Tests Ordered: Current medicines are reviewed at length with the patient today.  Concerns regarding medicines are outlined above.   Tests Ordered: No orders of the defined types were placed in this encounter.   Medication Changes: No orders of the defined types were placed in this encounter.   Disposition:  Follow up July 2021 Signed, Freddy Finner, NP  02/07/2019 3:05 PM     [facility]

## 2019-02-07 NOTE — Patient Instructions (Addendum)
    I appreciate the opportunity to provide you with the care for your health and wellness. Today we discussed: anxiety   Follow up: July 2021 for annual vision, no pap  No labs or referrals today  Take care and have a happy, safe, and healthy Holiday   Glad your anxiety is better and you are enjoy life more these days. Call me if you need me before your next appt.   Please continue to practice social distancing to keep you, your family, and our community safe.  If you must go out, please wear a mask and practice good handwashing.  It was a pleasure to see you and I look forward to continuing to work together on your health and well-being. Please do not hesitate to call the office if you need care or have questions about your care.  Have a wonderful day and week. With Gratitude, Cherly Beach, DNP, AGNP-BC

## 2019-03-25 ENCOUNTER — Encounter: Payer: Self-pay | Admitting: Family Medicine

## 2019-03-25 ENCOUNTER — Other Ambulatory Visit: Payer: Self-pay

## 2019-03-25 ENCOUNTER — Ambulatory Visit (INDEPENDENT_AMBULATORY_CARE_PROVIDER_SITE_OTHER): Payer: Medicaid Other | Admitting: Family Medicine

## 2019-03-25 VITALS — BP 104/70 | HR 81 | Temp 98.7°F | Resp 15 | Ht 66.0 in | Wt 169.0 lb

## 2019-03-25 DIAGNOSIS — M25562 Pain in left knee: Secondary | ICD-10-CM

## 2019-03-25 DIAGNOSIS — G43809 Other migraine, not intractable, without status migrainosus: Secondary | ICD-10-CM | POA: Diagnosis not present

## 2019-03-25 DIAGNOSIS — G43909 Migraine, unspecified, not intractable, without status migrainosus: Secondary | ICD-10-CM | POA: Insufficient documentation

## 2019-03-25 HISTORY — DX: Pain in left knee: M25.562

## 2019-03-25 MED ORDER — PREDNISONE 10 MG (21) PO TBPK: ORAL_TABLET | ORAL | 0 refills | Status: DC

## 2019-03-25 MED ORDER — RIZATRIPTAN BENZOATE 5 MG PO TABS: 5.0000 mg | ORAL_TABLET | ORAL | 0 refills | Status: DC | PRN

## 2019-03-25 NOTE — Assessment & Plan Note (Signed)
Needs refill of Maxalt.  Provided reports migraines are controlled.

## 2019-03-25 NOTE — Progress Notes (Signed)
Subjective:  Patient ID: Caitlin Higgins, female    DOB: 1986-01-26  Age: 33 y.o. MRN: [id number]  CC:  Chief Complaint  Patient presents with  . Knee Pain    left knee      HPI  Knee Pain  The incident occurred more than 1 week ago (2 months ago-worse in last 2 weeks. was doing home work outs-that included weight bearing exercise.). The injury mechanism is unknown. The pain is present in the left knee. The quality of the pain is described as aching and stabbing. The pain is at a severity of 5/10. The pain is moderate. The pain has been intermittent since onset. Pertinent negatives include no loss of motion, loss of sensation, muscle weakness, numbness or tingling. Associated symptoms comments: Hard to weight bear at times. Hard to go up stairs and walk.. She reports no foreign bodies present. The symptoms are aggravated by movement and weight bearing. She has tried acetaminophen and ice (muscle rubs) for the symptoms. The treatment provided mild relief.    Today patient denies signs and symptoms of COVID 19 infection including fever, chills, cough, shortness of breath, and headache. Past Medical, Surgical, Social History, Allergies, and Medications have been Reviewed.   Past Medical History:  Diagnosis Date  . Abnormal Papanicolaou smear of cervix with positive human papilloma virus (HPV) test 01/21/2019   Pap +HPV 16 and ASCH, will get colpo  . Anxiety   . Cervical high risk HPV (human papillomavirus) test positive 01/24/2017   Repeat pap in 1 year  . Migraine   . PCOS (polycystic ovarian syndrome)     Current Meds  Medication Sig  . busPIRone (BUSPAR) 5 MG tablet Take 1 tablet (5 mg total) by mouth 3 (three) times daily. (Patient taking differently: Take 5 mg by mouth as needed. )  . butalbital-acetaminophen-caffeine (FIORICET) 50-325-40 MG tablet Take 1-2 tablets by mouth every 8 (eight) hours as needed for migraine.  Marland Kitchen levonorgestrel (MIRENA) 20 MCG/24HR IUD 1 each by  Intrauterine route once.  . rizatriptan (MAXALT) 5 MG tablet Take 1 tablet (5 mg total) by mouth as needed for migraine. May repeat in 2 hours if needed  . [DISCONTINUED] rizatriptan (MAXALT) 5 MG tablet Take 1 tablet (5 mg total) by mouth as needed for migraine. May repeat in 2 hours if needed    Review of Systems  Constitutional: Negative.   HENT: Negative.   Eyes: Negative.   Respiratory: Negative.   Cardiovascular: Negative.   Gastrointestinal: Negative.   Endocrine: Negative.   Genitourinary: Negative.   Musculoskeletal:       Knee pain   Skin: Negative.   Allergic/Immunologic: Negative.   Neurological: Negative.  Negative for tingling and numbness.  Hematological: Negative.   Psychiatric/Behavioral: Negative.   All other systems reviewed and are negative.    Objective:   Today's Vitals: BP 104/70   Pulse 81   Temp 98.7 F (37.1 C) (Oral)   Resp 15   Ht 5\' 6"  (1.676 m)   Wt 169 lb (76.7 kg)   SpO2 97%   BMI 27.28 kg/m  Vitals with BMI 03/25/2019 02/04/2019 01/09/2019  Height 5\' 6"  5\' 6"  5\' 6"   Weight 169 lbs 166 lbs 167 lbs 6 oz  BMI 27.29 26.81 27.03  Systolic 104 120 03/11/2019  Diastolic 70 81 64  Pulse 81 58 65     Physical Exam Vitals and nursing note reviewed.  Constitutional:      Appearance: Normal appearance.  She is well-developed, well-groomed and overweight.  HENT:     Head: Normocephalic and atraumatic.     Right Ear: External ear normal.     Left Ear: External ear normal.     Nose: Nose normal.     Mouth/Throat:     Pharynx: Oropharynx is clear.  Eyes:     General:        Right eye: No discharge.        Left eye: No discharge.     Conjunctiva/sclera: Conjunctivae normal.  Cardiovascular:     Rate and Rhythm: Normal rate and regular rhythm.     Pulses: Normal pulses.     Heart sounds: Normal heart sounds.  Pulmonary:     Effort: Pulmonary effort is normal.     Breath sounds: Normal breath sounds.  Musculoskeletal:     Cervical back: Normal  range of motion and neck supple.     Right knee: Normal. No crepitus. No LCL laxity, MCL laxity, ACL laxity or PCL laxity. Normal pulse.     Instability Tests: Negative medial McMurray test and negative lateral McMurray test.     Left knee: Normal. No crepitus. No LCL laxity, MCL laxity, ACL laxity or PCL laxity.Normal pulse.     Instability Tests: Negative medial McMurray test and negative lateral McMurray test.  Skin:    General: Skin is warm.  Neurological:     General: No focal deficit present.     Mental Status: She is alert and oriented to person, place, and time.  Psychiatric:        Attention and Perception: Attention normal.        Mood and Affect: Mood normal.        Speech: Speech normal.        Behavior: Behavior normal. Behavior is cooperative.        Thought Content: Thought content normal.        Judgment: Judgment normal.          Assessment   1. Acute pain of left knee   2. Other migraine without status migrainosus, not intractable     Tests ordered Orders Placed This Encounter  Procedures  . DG Knee 1-2 Views Left  . Ambulatory referral to Orthopedic Surgery     Plan: Please see assessment and plan per problem list above.   Meds ordered this encounter  Medications  . predniSONE (STERAPRED UNI-PAK 21 TAB) 10 MG (21) TBPK tablet    Sig: Take as directed on the package    Dispense:  21 tablet    Refill:  0    Order Specific Question:   Supervising Provider    Answer:   SIMPSON, MARGARET E [[id number]]  . rizatriptan (MAXALT) 5 MG tablet    Sig: Take 1 tablet (5 mg total) by mouth as needed for migraine. May repeat in 2 hours if needed    Dispense:  10 tablet    Refill:  0    Order Specific Question:   Supervising Provider    Answer:   Fayrene Helper [[id number]]    Patient to follow-up in 1-2 weeks.  Perlie Mayo, NP

## 2019-03-25 NOTE — Assessment & Plan Note (Signed)
No identifiable cause of medial left knee pain.  Acute in nature but with possible overuse injury did not notice any laxity lacking in the joints was not able to reproduce discomfort or pain.  Short course of prednisone and x-ray along with referral to Ortho ordered today.  Provided with rice measures until then.  Advised to call the clinic back after medication if not better.

## 2019-03-25 NOTE — Patient Instructions (Addendum)
  I appreciate the opportunity to provide you with care for your health and wellness. Today we discussed: knee pain   Follow up:  1-2 weeks for skin tag removal   No labs   Referral to Ortho   XRAY at [facility] this week when you can.  Start Prednisone for inflammation of knee. If not better call the office back after you finish it.  REST:  Take it easy; reduce regular exercise of activities of daily living as needed, but do not eliminate them. Change activity or components of activity; for example change running to walking. Stop sports; restrict squatting, kneeling, and repetitive bending.  COMPRESSION: Use elastic bandages. Avoid trying to provide support with elastic bandages; a brace, overlapping tape, or cast may be needed.  ELEVATION: Elevate the joint above the level of the heart to reduce swelling. Apply cold compresses while the joint is elevated.  I hope you have a wonderful, happy, safe, and healthy Holiday Season! See you in the New Year :)  Please continue to practice social distancing to keep you, your family, and our community safe.  If you must go out, please wear a mask and practice good handwashing.  It was a pleasure to see you and I look forward to continuing to work together on your health and well-being. Please do not hesitate to call the office if you need care or have questions about your care.  Have a wonderful day and week. With Gratitude, Cherly Beach, DNP, AGNP-BC

## 2019-04-04 ENCOUNTER — Ambulatory Visit: Payer: Medicaid Other | Admitting: Family Medicine

## 2019-04-15 ENCOUNTER — Other Ambulatory Visit: Payer: Self-pay

## 2019-04-15 ENCOUNTER — Encounter (HOSPITAL_COMMUNITY): Payer: Self-pay

## 2019-04-15 ENCOUNTER — Ambulatory Visit (HOSPITAL_COMMUNITY)
Admission: RE | Admit: 2019-04-15 | Discharge: 2019-04-15 | Disposition: A | Discharge status: Home / Self Care | Payer: Medicaid Other | Source: Ambulatory Visit | Attending: Family Medicine | Admitting: Family Medicine

## 2019-04-15 DIAGNOSIS — M25562 Pain in left knee: Secondary | ICD-10-CM | POA: Insufficient documentation

## 2019-04-16 ENCOUNTER — Encounter: Payer: Self-pay | Admitting: Orthopaedic Surgery

## 2019-04-16 ENCOUNTER — Ambulatory Visit: Payer: Medicaid Other | Admitting: Orthopaedic Surgery

## 2019-04-16 VITALS — BP 116/71 | HR 73 | Temp 97.1°F | Ht 66.0 in | Wt 171.0 lb

## 2019-04-16 DIAGNOSIS — M25562 Pain in left knee: Secondary | ICD-10-CM | POA: Diagnosis not present

## 2019-04-16 DIAGNOSIS — G8929 Other chronic pain: Secondary | ICD-10-CM

## 2019-04-16 DIAGNOSIS — M958 Other specified acquired deformities of musculoskeletal system: Secondary | ICD-10-CM | POA: Diagnosis not present

## 2019-04-16 NOTE — Addendum Note (Signed)
Addended by: Baird Kay on: 04/16/2019 09:58 AM   Modules accepted: Orders
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
## 2019-04-17 ENCOUNTER — Ambulatory Visit: Payer: Medicaid Other | Admitting: Family Medicine

## 2019-04-19 ENCOUNTER — Ambulatory Visit: Payer: Medicaid Other | Admitting: Family Medicine

## 2019-04-25 ENCOUNTER — Encounter: Payer: Self-pay | Admitting: Family Medicine

## 2019-04-25 ENCOUNTER — Other Ambulatory Visit: Payer: Self-pay

## 2019-04-25 ENCOUNTER — Ambulatory Visit (INDEPENDENT_AMBULATORY_CARE_PROVIDER_SITE_OTHER): Payer: Medicaid Other | Admitting: Family Medicine

## 2019-04-25 VITALS — BP 110/68 | HR 80 | Temp 97.7°F | Resp 15 | Ht 66.0 in | Wt 171.0 lb

## 2019-04-25 DIAGNOSIS — L918 Other hypertrophic disorders of the skin: Secondary | ICD-10-CM | POA: Diagnosis not present

## 2019-04-25 HISTORY — DX: Other hypertrophic disorders of the skin: L91.8

## 2019-04-25 NOTE — Patient Instructions (Addendum)
Happy New Year! May you have a year filled with hope, love, happiness and laughter.  I appreciate the opportunity to provide you with care for your health and wellness. Today we discussed: skin tag removals  Follow up: 10/15/2019  No labs or referrals today  Keep both sites clean and dry. Apply the antibiotic ointment daily until healed. If you have any signs of infection call the office.  Please continue to practice social distancing to keep you, your family, and our community safe.  If you must go out, please wear a mask and practice good handwashing.  It was a pleasure to see you and I look forward to continuing to work together on your health and well-being. Please do not hesitate to call the office if you need care or have questions about your care.  Have a wonderful day and week. With Gratitude, Tereasa Coop, DNP, AGNP-BC

## 2019-04-25 NOTE — Progress Notes (Signed)
Subjective:  Patient ID: Caitlin Higgins, female    DOB: [birth year]  Age: 34 y.o. MRN: [id number]  CC:  Chief Complaint  Patient presents with  . skint tag removal      HPI  HPI [name] is here for skin tags to be removed. She denies any other concerns today.   Today patient denies signs and symptoms of COVID 19 infection including fever, chills, cough, shortness of breath, and headache. Past Medical, Surgical, Social History, Allergies, and Medications have been Reviewed.   Past Medical History:  Diagnosis Date  . Abnormal Papanicolaou smear of cervix with positive human papilloma virus (HPV) test 01/21/2019   Pap +HPV 16 and ASCH, will get colpo  . Anxiety   . Cervical high risk HPV (human papillomavirus) test positive 01/24/2017   Repeat pap in 1 year  . Migraine   . PCOS (polycystic ovarian syndrome)     Current Meds  Medication Sig  . busPIRone (BUSPAR) 5 MG tablet Take 1 tablet (5 mg total) by mouth 3 (three) times daily. (Patient taking differently: Take 5 mg by mouth as needed. )  . butalbital-acetaminophen-caffeine (FIORICET) 50-325-40 MG tablet Take 1-2 tablets by mouth every 8 (eight) hours as needed for migraine.  Marland Kitchen levonorgestrel (MIRENA) 20 MCG/24HR IUD 1 each by Intrauterine route once.  . predniSONE (STERAPRED UNI-PAK 21 TAB) 10 MG (21) TBPK tablet Take as directed on the package  . rizatriptan (MAXALT) 5 MG tablet Take 1 tablet (5 mg total) by mouth as needed for migraine. May repeat in 2 hours if needed    ROS:  Review of Systems  Constitutional: Negative.   HENT: Negative.   Eyes: Negative.   Respiratory: Negative.   Cardiovascular: Negative.   Gastrointestinal: Negative.   Genitourinary: Negative.   Musculoskeletal: Negative.   Skin: Negative.        Skin tags    Neurological: Negative.   Endo/Heme/Allergies: Negative.   Psychiatric/Behavioral: Negative.   All other systems reviewed and are negative.    Objective:   Today's  Vitals: BP 110/68   Pulse 80   Temp 97.7 F (36.5 C) (Temporal)   Resp 15   Ht 5\' 6"  (1.676 m)   Wt 171 lb (77.6 kg)   SpO2 97%   BMI 27.60 kg/m  Vitals with BMI 04/25/2019 04/16/2019 03/25/2019  Height 5\' 6"  5\' 6"  5\' 6"   Weight 171 lbs 171 lbs 169 lbs  BMI 27.61 66.44 03.47  Systolic 425 956 387  Diastolic 68 71 70  Pulse 80 73 81     Physical Exam Vitals and nursing note reviewed.  Constitutional:      Appearance: Normal appearance. She is well-developed, well-groomed and overweight.  HENT:     Head: Normocephalic and atraumatic.     Right Ear: External ear normal.     Left Ear: External ear normal.     Nose: Nose normal.     Mouth/Throat:     Mouth: Mucous membranes are moist.     Pharynx: Oropharynx is clear.  Eyes:     General:        Right eye: No discharge.        Left eye: No discharge.     Conjunctiva/sclera: Conjunctivae normal.  Cardiovascular:     Rate and Rhythm: Normal rate and regular rhythm.     Pulses: Normal pulses.     Heart sounds: Normal heart sounds.  Pulmonary:     Effort: Pulmonary effort is  normal.     Breath sounds: Normal breath sounds.  Musculoskeletal:        General: Normal range of motion.     Cervical back: Normal range of motion and neck supple.  Skin:    General: Skin is warm.     Comments: right side of neck line lateral (34mm) and left axillary (2-70mm)  Neurological:     General: No focal deficit present.     Mental Status: She is alert and oriented to person, place, and time.  Psychiatric:        Attention and Perception: Attention normal.        Mood and Affect: Mood normal.        Speech: Speech normal.        Behavior: Behavior normal. Behavior is cooperative.        Thought Content: Thought content normal.        Cognition and Memory: Cognition normal.        Judgment: Judgment normal.     Procedure: Informed consent verbalized, chaperone Abby, CMA Skin tags were snipped off using Betadine for cleansing and sterile  scaple. Local anesthesia was not used. Site was dressed with antibiotic ointment and sterile gauze with tape. These pathognomonic lesions are not sent for pathology.  Assessment   1. Skin tag     Tests ordered No orders of the defined types were placed in this encounter.  Plan: Please see assessment and plan per problem list above.   No orders of the defined types were placed in this encounter.  Patient to follow-up in as scheduled  Freddy Finner, NP

## 2019-04-25 NOTE — Assessment & Plan Note (Signed)
Skin tags were snipped off using Betadine for cleansing and sterile scaple. Local anesthesia was not used. Site was dressed with antibiotic ointment and sterile gauze with tape. These pathognomonic lesions are not sent for pathology.

## 2019-05-01 ENCOUNTER — Ambulatory Visit (HOSPITAL_COMMUNITY)
Admission: RE | Admit: 2019-05-01 | Discharge: 2019-05-01 | Disposition: A | Discharge status: Home / Self Care | Payer: Medicaid Other | Source: Ambulatory Visit | Attending: Orthopaedic Surgery | Admitting: Orthopaedic Surgery

## 2019-05-01 ENCOUNTER — Other Ambulatory Visit: Payer: Self-pay

## 2019-05-01 DIAGNOSIS — G8929 Other chronic pain: Secondary | ICD-10-CM | POA: Insufficient documentation

## 2019-05-01 DIAGNOSIS — M25562 Pain in left knee: Secondary | ICD-10-CM | POA: Insufficient documentation

## 2019-05-07 ENCOUNTER — Ambulatory Visit: Payer: Medicaid Other | Admitting: Orthopaedic Surgery

## 2019-05-07 ENCOUNTER — Encounter: Payer: Self-pay | Admitting: Orthopaedic Surgery

## 2019-05-07 ENCOUNTER — Other Ambulatory Visit: Payer: Self-pay

## 2019-05-07 VITALS — BP 122/66 | HR 70 | Ht 66.0 in | Wt 170.0 lb

## 2019-05-07 DIAGNOSIS — G8929 Other chronic pain: Secondary | ICD-10-CM | POA: Diagnosis not present

## 2019-05-07 DIAGNOSIS — M25562 Pain in left knee: Secondary | ICD-10-CM | POA: Diagnosis not present

## 2019-05-07 NOTE — Progress Notes (Signed)
Patient Caitlin Higgins, female DOB:[birth year], 33 y.o. WUX:[id number]  Chief Complaint  Patient presents with  . Knee Pain    HPI  Caitlin Higgins is a 34 y.o. female who has left knee pain.  There was a question about an osteochondral defect of the knee.  She had a MRI and it showed: IMPRESSION: Fraying along the free edge of the body of the medial meniscus. There is also small tear in the body of the medial meniscus reaches the capsular surface but there is no tear reaching an articular surface.  I have explained the findings to her.  I have offered she see Dr. Aline Brochure for possible surgery if her knee is a constant bother. She says it hurts only now and then.  She would like to wait and see how it does.  That is very reasonable.  Body mass index is 27.44 kg/m.  ROS  Review of Systems  Constitutional: Positive for activity change.  Musculoskeletal: Positive for arthralgias, gait problem and joint swelling.  Psychiatric/Behavioral: The patient is nervous/anxious.   All other systems reviewed and are negative.   All other systems reviewed and are negative.  The following is a summary of the past history medically, past history surgically, known current medicines, social history and family history.  This information is gathered electronically by the computer from prior information and documentation.  I review this each visit and have found including this information at this point in the chart is beneficial and informative.    Past Medical History:  Diagnosis Date  . Abnormal Papanicolaou smear of cervix with positive human papilloma virus (HPV) test 01/21/2019   Pap +HPV 16 and ASCH, will get colpo  . Anxiety   . Cervical high risk HPV (human papillomavirus) test positive 01/24/2017   Repeat pap in 1 year  . Migraine   . PCOS (polycystic ovarian syndrome)     Past Surgical History:  Procedure Laterality Date  . BUTTOCK LIFT Bilateral 03/21/18    Family History  Problem  Relation Age of Onset  . Breast cancer Maternal Grandmother   . High Cholesterol Mother   . Autism Daughter   . Blindness Daughter     Social History Social History   Tobacco Use  . Smoking status: Never Smoker  . Smokeless tobacco: Never Used  Substance Use Topics  . Alcohol use: No  . Drug use: No    No Known Allergies  Current Outpatient Medications  Medication Sig Dispense Refill  . busPIRone (BUSPAR) 5 MG tablet Take 1 tablet (5 mg total) by mouth 3 (three) times daily. (Patient taking differently: Take 5 mg by mouth as needed. ) 30 tablet 1  . butalbital-acetaminophen-caffeine (FIORICET) 50-325-40 MG tablet Take 1-2 tablets by mouth every 8 (eight) hours as needed for migraine. 20 tablet 0  . levonorgestrel (MIRENA) 20 MCG/24HR IUD 1 each by Intrauterine route once.    . predniSONE (STERAPRED UNI-PAK 21 TAB) 10 MG (21) TBPK tablet Take as directed on the package 21 tablet 0  . rizatriptan (MAXALT) 5 MG tablet Take 1 tablet (5 mg total) by mouth as needed for migraine. May repeat in 2 hours if needed 10 tablet 0   No current facility-administered medications for this visit.     Physical Exam  Blood pressure 122/66, pulse 70, height 5\' 6"  (1.676 m), weight 170 lb (77.1 kg).  Constitutional: overall normal hygiene, normal nutrition, well developed, normal grooming, normal body habitus. Assistive device:none  Musculoskeletal: gait and station  Limp none, muscle tone and strength are normal, no tremors or atrophy is present.  .  Neurological: coordination overall normal.  Deep tendon reflex/nerve stretch intact.  Sensation normal.  Cranial nerves II-XII intact.   Skin:   Normal overall no scars, lesions, ulcers or rashes. No psoriasis.  Psychiatric: Alert and oriented x 3.  Recent memory intact, remote memory unclear.  Normal mood and affect. Well groomed.  Good eye contact.  Cardiovascular: overall no swelling, no varicosities, no edema bilaterally, normal temperatures  of the legs and arms, no clubbing, cyanosis and good capillary refill.  Lymphatic: palpation is normal.  The left knee is not hurting today and has full ROM and no limp.  NV intact.  All other systems reviewed and are negative   The patient has been educated about the nature of the problem(s) and counseled on treatment options.  The patient appeared to understand what I have discussed and is in agreement with it.  Encounter Diagnosis  Name Primary?  . Chronic pain of left knee Yes    PLAN Call if any problems.  Precautions discussed.  Continue current medications.   Return to clinic 1 month   Electronically Signed Darreld Mclean, MD 2/2/20218:36 AM

## 2019-05-07 NOTE — Patient Instructions (Signed)
Meniscus Tear  A meniscus tear is a knee injury that happens when a piece of the meniscus is torn. The meniscus is a thick, rubbery, wedge-shaped cartilage in the knee. Two menisci are located in each knee. They sit between the upper bone (femur) and lower bone (tibia) that make up the knee joint. Each meniscus acts as a shock absorber for the knee. A torn meniscus is one of the most common types of knee injuries. This injury can range from mild to severe. Surgery may be needed to repair a severe tear. What are the causes? This condition may be caused by any kneeling, squatting, twisting, or pivoting movement. Sports-related injuries are the most common cause. These often occur from:  Running and stopping suddenly. ? Changing direction. ? Being tackled or knocked off your feet.  Lifting or carrying heavy weights. As people get older, their menisci get thinner and weaker. In these people, tears can happen more easily, such as from climbing stairs. What increases the risk? You are more likely to develop this condition if you:  Play contact sports.  Have a job that requires kneeling or squatting.  Are female.  Are over 40 years old. What are the signs or symptoms? Symptoms of this condition include:  Knee pain, especially at the side of the knee joint. You may feel pain when the injury occurs, or you may only hear a pop and feel pain later.  A feeling that your knee is clicking, catching, locking, or giving way.  Not being able to fully bend or extend your knee.  Bruising or swelling in your knee. How is this diagnosed? This condition may be diagnosed based on your symptoms and a physical exam. You may also have tests, such as:  X-rays.  MRI.  A procedure to look inside your knee with a narrow surgical telescope (arthroscopy). You may be referred to a knee specialist (orthopedic surgeon). How is this treated? Treatment for this injury depends on the severity of the tear.  Treatment for a mild tear may include:  Rest.  Medicine to reduce pain and swelling. This is usually a nonsteroidal anti-inflammatory drug (NSAID), like ibuprofen.  A knee brace, sleeve, or wrap.  Using crutches or a walker to keep weight off your knee and to help you walk.  Exercises to strengthen your knee (physical therapy). You may need surgery if you have a severe tear or if other treatments are not working. Follow these instructions at home: If you have a brace, sleeve, or wrap:  Wear it as told by your health care provider. Remove it only as told by your health care provider.  Loosen the brace, sleeve, or wrap if your toes tingle, become numb, or turn cold and blue.  Keep the brace, sleeve, or wrap clean and dry.  If the brace, sleeve, or wrap is not waterproof: ? Do not let it get wet. ? Cover it with a watertight covering when you take a bath or shower. Managing pain and swelling   Take over-the-counter and prescription medicines only as told by your health care provider.  If directed, put ice on your knee: ? If you have a removable brace, sleeve, or wrap, remove it as told by your health care provider. ? Put ice in a plastic bag. ? Place a towel between your skin and the bag. ? Leave the ice on for 20 minutes, 2-3 times per day.  Move your toes often to avoid stiffness and to lessen swelling.  Raise (  elevate) the injured area above the level of your heart while you are sitting or lying down. Activity  Do not use the injured limb to support your body weight until your health care provider says that you can. Use crutches or a walker as told by your health care provider.  Return to your normal activities as told by your health care provider. Ask your health care provider what activities are safe for you.  Perform range-of-motion exercises only as told by your health care provider.  Begin doing exercises to strengthen your knee and leg muscles only as told by your  health care provider. After you recover, your health care provider may recommend these exercises to help prevent another injury. General instructions  Use a knee brace, sleeve, or wrap as told by your health care provider.  Ask your health care provider when it is safe to drive if you have a brace, sleeve, or wrap on your knee.  Do not use any products that contain nicotine or tobacco, such as cigarettes, e-cigarettes, and chewing tobacco. If you need help quitting, ask your health care provider.  Ask your health care provider if the medicine prescribed to you: ? Requires you to avoid driving or using heavy machinery. ? Can cause constipation. You may need to take these actions to prevent or treat constipation:  Drink enough fluid to keep your urine pale yellow.  Take over-the-counter or prescription medicines.  Eat foods that are high in fiber, such as beans, whole grains, and fresh fruits and vegetables.  Limit foods that are high in fat and processed sugars, such as fried or sweet foods.  Keep all follow-up visits as told by your health care provider. This is important. Contact a health care provider if:  You have a fever.  Your knee becomes red, tender, or swollen.  Your pain medicine is not helping.  Your symptoms get worse or do not improve after 2 weeks of home care. Summary  A meniscus tear is a knee injury that happens when a piece of the meniscus is torn.  Treatment for this injury depends on the severity of the tear. You may need surgery if you have a severe tear or if other treatments are not working.  Rest, ice, and raise (elevate) your injured knee as told by your health care provider. This will help lessen pain and swelling.  Contact a health care provider if you have new symptoms, or your symptoms get worse or do not improve after 2 weeks of home care.  Keep all follow-up visits as told by your health care provider. This is important. This information is not  intended to replace advice given to you by your health care provider. Make sure you discuss any questions you have with your health care provider. Document Revised: 10/03/2017 Document Reviewed: 10/03/2017 Elsevier Patient Education  2020 [employer]  

## 2019-06-04 ENCOUNTER — Ambulatory Visit: Payer: Medicaid Other | Admitting: Orthopaedic Surgery

## 2019-07-09 ENCOUNTER — Other Ambulatory Visit: Payer: Self-pay | Admitting: Family Medicine

## 2019-07-09 DIAGNOSIS — G43809 Other migraine, not intractable, without status migrainosus: Secondary | ICD-10-CM

## 2019-07-09 DIAGNOSIS — M25562 Pain in left knee: Secondary | ICD-10-CM

## 2019-07-16 ENCOUNTER — Telehealth (INDEPENDENT_AMBULATORY_CARE_PROVIDER_SITE_OTHER): Payer: Medicaid Other | Admitting: Family Medicine

## 2019-07-16 ENCOUNTER — Other Ambulatory Visit: Payer: Self-pay

## 2019-07-16 VITALS — BP 122/66 | Ht 66.0 in | Wt 170.0 lb

## 2019-07-16 DIAGNOSIS — Z638 Other specified problems related to primary support group: Secondary | ICD-10-CM | POA: Diagnosis not present

## 2019-07-16 DIAGNOSIS — F419 Anxiety disorder, unspecified: Secondary | ICD-10-CM

## 2019-07-16 DIAGNOSIS — Z6379 Other stressful life events affecting family and household: Secondary | ICD-10-CM

## 2019-07-16 MED ORDER — ESCITALOPRAM OXALATE 10 MG PO TABS: 10.0000 mg | ORAL_TABLET | Freq: Every day | ORAL | 1 refills | Status: DC

## 2019-07-16 MED ORDER — BUSPIRONE HCL 5 MG PO TABS: 5.0000 mg | ORAL_TABLET | Freq: Two times a day (BID) | ORAL | 1 refills | Status: DC

## 2019-07-16 NOTE — Progress Notes (Signed)
This visit type was conducted due to national recommendations for restrictions regarding the COVID-19 Pandemic (e.g. social distancing) in an effort to limit this patient's exposure and mitigate transmission in our community.  Due to her co-morbid illnesses, this patient is at least at moderate risk for complications without adequate follow up.  This format is felt to be most appropriate for this patient at this time. Limited PE due to video format.  Evaluation Performed:  Follow-up visit  Date:  07/16/2019   ID:  Caitlin Higgins, DOB [birth year], MRN [id number]  Patient Location: Home Provider Location: Office  Location of Patient: Home Location of Provider: Telehealth Consent was obtain for visit to be over via telehealth. I verified that I am speaking with the correct person using two identifiers.  PCP:  Caitlin Finner, NP   Chief Complaint:  Uncontrolled anxiety   History of Present Illness:    Caitlin Higgins is a 34 y.o. female with current ongoing anxiety and migraines.  Previously started on BuSpar to use as needed.  Reported that it was helping her.  But now she feels like she is just not doing very well at all she is overwhelmed constantly overthinking.  Reports that she is irritable she has a hard time staying on task.  She reports that she is sad and cries a lot secondary to how she is feeling right now.  She is scared that this is going to affect the job that she has when she gets back to work.  Which also bothers her as this job is going to take her out of the state and she will not be able to see her children so often.  Which she does not really want to do.  She is open to medication daily and therapy.  The patient does not have symptoms concerning for COVID-19 infection (fever, chills, cough, or new shortness of breath).   Past Medical, Surgical, Social History, Allergies, and Medications have been Reviewed.  Past Medical History:  Diagnosis Date  . Abnormal Papanicolaou  smear of cervix with positive human papilloma virus (HPV) test 01/21/2019   Pap +HPV 16 and ASCH, will get colpo  . Anxiety   . Cervical high risk HPV (human papillomavirus) test positive 01/24/2017   Repeat pap in 1 year  . Migraine   . PCOS (polycystic ovarian syndrome)    Past Surgical History:  Procedure Laterality Date  . BUTTOCK LIFT Bilateral 03/21/18     Current Meds  Medication Sig  . busPIRone (BUSPAR) 5 MG tablet Take 1 tablet (5 mg total) by mouth 2 (two) times daily.  . butalbital-acetaminophen-caffeine (FIORICET) 50-325-40 MG tablet Take 1-2 tablets by mouth every 8 (eight) hours as needed for migraine.  Marland Kitchen levonorgestrel (MIRENA) 20 MCG/24HR IUD 1 each by Intrauterine route once.  . rizatriptan (MAXALT) 5 MG tablet Take 1 tablet (5 mg total) by mouth as needed for migraine. May repeat in 2 hours if needed  . [DISCONTINUED] busPIRone (BUSPAR) 5 MG tablet Take 1 tablet (5 mg total) by mouth 3 (three) times daily. (Patient taking differently: Take 5 mg by mouth as needed. )     Allergies:   Patient has no known allergies.   ROS:   Please see the history of present illness.    All other systems reviewed and are negative.   Labs/Other Tests and Data Reviewed:    Recent Labs: 01/10/2019: ALT 11; BUN 11; Creatinine, Ser 0.66; Hemoglobin 13.2; Platelets 205;  Potassium 4.0; Sodium 140; TSH 2.830   Recent Lipid Panel Lab Results  Component Value Date/Time   CHOL 170 01/10/2019 08:40 AM   TRIG 61 01/10/2019 08:40 AM   HDL 29 (L) 01/10/2019 08:40 AM   CHOLHDL 5.9 (H) 01/10/2019 08:40 AM   LDLCALC 129 (H) 01/10/2019 08:40 AM    Wt Readings from Last 3 Encounters:  07/16/19 170 lb (77.1 kg)  05/07/19 170 lb (77.1 kg)  04/25/19 171 lb (77.6 kg)     Objective:    Vital Signs:  BP 122/66   Ht 5\' 6"  (1.676 m)   Wt 170 lb (77.1 kg)   BMI 27.44 kg/m    VITAL SIGNS:  reviewed GEN:  Alert and oriented EYES:  sclerae anicteric, EOMI - Extraocular Movements  Intact RESPIRATORY:  No shortness of breath noted in conversation SKIN:  no rash, lesions or ulcers. MUSCULOSKELETAL:  no obvious deformities. NEURO:  alert and oriented x 3, no obvious focal deficit PSYCH:  Depressed mood, tearful affect  Depression screen [facility] 2/9 04/25/2019 03/25/2019 02/07/2019  Decreased Interest 0 0 0  Down, Depressed, Hopeless 0 0 0  PHQ - 2 Score 0 0 0  Altered sleeping - - -  Tired, decreased energy - - -  Change in appetite - - -  Feeling bad or failure about yourself  - - -  Trouble concentrating - - -  Moving slowly or fidgety/restless - - -  Suicidal thoughts - - -  PHQ-9 Score - - -  Difficult doing work/chores - - -    GAD 7 : Generalized Anxiety Score 07/16/2019 02/07/2019 01/23/2019 10/23/2018  Nervous, Anxious, on Edge 3 0 1 0  Control/stop worrying 3 0 0 2  Worry too much - different things 3 0 0 2  Trouble relaxing 3 0 0 0  Restless 2 0 0 2  Easily annoyed or irritable 3 0 1 0  Afraid - awful might happen 2 0 1 1  Total GAD 7 Score 19 0 3 7  Anxiety Difficulty Very difficult Not difficult at all Somewhat difficult Somewhat difficult     ASSESSMENT & PLAN:    1. Anxiety  - escitalopram (LEXAPRO) 10 MG tablet; Take 1 tablet (10 mg total) by mouth daily.  Dispense: 30 tablet; Refill: 1 - busPIRone (BUSPAR) 5 MG tablet; Take 1 tablet (5 mg total) by mouth 2 (two) times daily.  Dispense: 60 tablet; Refill: 1 - Ambulatory referral to Behavioral Health  2. Caregiver role strain  - Ambulatory referral to Behavioral Health  3. Stressful life events affecting family and household  - Ambulatory referral to Behavioral Health   Time:   Today, I have spent 20 minutes with the patient with telehealth technology discussing the above problems.     Medication Adjustments/Labs and Tests Ordered: Current medicines are reviewed at length with the patient today.  Concerns regarding medicines are outlined above.   Tests Ordered: Orders Placed This  Encounter  Procedures  . Ambulatory referral to Behavioral Health    Medication Changes: Meds ordered this encounter  Medications  . escitalopram (LEXAPRO) 10 MG tablet    Sig: Take 1 tablet (10 mg total) by mouth daily.    Dispense:  30 tablet    Refill:  1    Order Specific Question:   Supervising Provider    Answer:   SIMPSON, MARGARET E [[id number]]  . busPIRone (BUSPAR) 5 MG tablet    Sig: Take 1 tablet (5 mg total) by  mouth 2 (two) times daily.    Dispense:  60 tablet    Refill:  1    Order Specific Question:   Supervising Provider    Answer:   Fayrene Helper [[id number]]    Disposition:  Follow up 2 weeks Signed, Perlie Mayo, NP  07/16/2019 11:23 AM     Caitlin Higgins

## 2019-07-17 ENCOUNTER — Encounter: Payer: Self-pay | Admitting: Family Medicine

## 2019-07-17 DIAGNOSIS — Z6379 Other stressful life events affecting family and household: Secondary | ICD-10-CM | POA: Insufficient documentation

## 2019-07-17 DIAGNOSIS — Z638 Other specified problems related to primary support group: Secondary | ICD-10-CM | POA: Insufficient documentation

## 2019-07-17 NOTE — Assessment & Plan Note (Signed)
Some improvement from previous events, but now she is going to have to work outside the home and this is creating a lot of distress. She reports feeling like a bad mother. Starting therapy and Lexapro with close follow up

## 2019-07-17 NOTE — Assessment & Plan Note (Signed)
Main caregiver and breadwinner. This is stressful for her.

## 2019-07-17 NOTE — Patient Instructions (Signed)
I appreciate the opportunity to provide you with care for your health and wellness. Today we discussed: anxiety   Follow up: 2 weeks by video    No labs   Urgent referral to therapy today.  Start Lexparo daily  Use buspar twice daily   Please continue to practice social distancing to keep you, your family, and our community safe.  If you must go out, please wear a mask and practice good handwashing.  It was a pleasure to see you and I look forward to continuing to work together on your health and well-being. Please do not hesitate to call the office if you need care or have questions about your care.  Have a wonderful day and week. With Gratitude, Cherly Beach, DNP, AGNP-BC   Managing Anxiety, Adult After being diagnosed with an anxiety disorder, you may be relieved to know why you have felt or behaved a certain way. You may also feel overwhelmed about the treatment ahead and what it will mean for your life. With care and support, you can manage this condition and recover from it. How to manage lifestyle changes Managing stress and anxiety  Stress is your body's reaction to life changes and events, both good and bad. Most stress will last just a few hours, but stress can be ongoing and can lead to more than just stress. Although stress can play a major role in anxiety, it is not the same as anxiety. Stress is usually caused by something external, such as a deadline, test, or competition. Stress normally passes after the triggering event has ended.  Anxiety is caused by something internal, such as imagining a terrible outcome or worrying that something will go wrong that will devastate you. Anxiety often does not go away even after the triggering event is over, and it can become long-term (chronic) worry. It is important to understand the differences between stress and anxiety and to manage your stress effectively so that it does not lead to an anxious response. Talk with your health  care provider or a counselor to learn more about reducing anxiety and stress. He or she may suggest tension reduction techniques, such as:  Music therapy. This can include creating or listening to music that you enjoy and that inspires you.  Mindfulness-based meditation. This involves being aware of your normal breaths while not trying to control your breathing. It can be done while sitting or walking.  Centering prayer. This involves focusing on a word, phrase, or sacred image that means something to you and brings you peace.  Deep breathing. To do this, expand your stomach and inhale slowly through your nose. Hold your breath for 3-5 seconds. Then exhale slowly, letting your stomach muscles relax.  Self-talk. This involves identifying thought patterns that lead to anxiety reactions and changing those patterns.  Muscle relaxation. This involves tensing muscles and then relaxing them. Choose a tension reduction technique that suits your lifestyle and personality. These techniques take time and practice. Set aside 5-15 minutes a day to do them. Therapists can offer counseling and training in these techniques. The training to help with anxiety may be covered by some insurance plans. Other things you can do to manage stress and anxiety include:  Keeping a stress/anxiety diary. This can help you learn what triggers your reaction and then learn ways to manage your response.  Thinking about how you react to certain situations. You may not be able to control everything, but you can control your response.  Making time  for activities that help you relax and not feeling guilty about spending your time in this way.  Visual imagery and yoga can help you stay calm and relax.  Medicines Medicines can help ease symptoms. Medicines for anxiety include:  Anti-anxiety drugs.  Antidepressants. Medicines are often used as a primary treatment for anxiety disorder. Medicines will be prescribed by a health  care provider. When used together, medicines, psychotherapy, and tension reduction techniques may be the most effective treatment. Relationships Relationships can play a big part in helping you recover. Try to spend more time connecting with trusted friends and family members. Consider going to couples counseling, taking family education classes, or going to family therapy. Therapy can help you and others better understand your condition. How to recognize changes in your anxiety Everyone responds differently to treatment for anxiety. Recovery from anxiety happens when symptoms decrease and stop interfering with your daily activities at home or work. This may mean that you will start to:  Have better concentration and focus. Worry will interfere less in your daily thinking.  Sleep better.  Be less irritable.  Have more energy.  Have improved memory. It is important to recognize when your condition is getting worse. Contact your health care provider if your symptoms interfere with home or work and you feel like your condition is not improving. Follow these instructions at home: Activity  Exercise. Most adults should do the following: ? Exercise for at least 150 minutes each week. The exercise should increase your heart rate and make you sweat (moderate-intensity exercise). ? Strengthening exercises at least twice a week.  Get the right amount and quality of sleep. Most adults need 7-9 hours of sleep each night. Lifestyle   Eat a healthy diet that includes plenty of vegetables, fruits, whole grains, low-fat dairy products, and lean protein. Do not eat a lot of foods that are high in solid fats, added sugars, or salt.  Make choices that simplify your life.  Do not use any products that contain nicotine or tobacco, such as cigarettes, e-cigarettes, and chewing tobacco. If you need help quitting, ask your health care provider.  Avoid caffeine, alcohol, and certain over-the-counter cold  medicines. These may make you feel worse. Ask your pharmacist which medicines to avoid. General instructions  Take over-the-counter and prescription medicines only as told by your health care provider.  Keep all follow-up visits as told by your health care provider. This is important. Where to find support You can get help and support from these sources:  Self-help groups.  Online and [employer].  A trusted spiritual leader.  Couples counseling.  Family education classes.  Family therapy. Where to find more information You may find that joining a support group helps you deal with your anxiety. The following sources can help you locate counselors or support groups near you:  Mental Health America: www.mentalhealthamerica.net  Anxiety and Depression Association of [place] (ADAA): ProgramCam.de  [employer] on Mental Illness (NAMI): www.nami.org Contact a health care provider if you:  Have a hard time staying focused or finishing daily tasks.  Spend many hours a day feeling worried about everyday life.  Become exhausted by worry.  Start to have headaches, feel tense, or have nausea.  Urinate more than normal.  Have diarrhea. Get help right away if you have:  A racing heart and shortness of breath.  Thoughts of hurting yourself or others. If you ever feel like you may hurt yourself or others, or have thoughts about taking your  own life, get help right away. You can go to your nearest emergency department or call:  Your local emergency services (911 in the [place]  A suicide crisis helpline, such as the National Suicide Prevention Lifeline at 681-340-8371. This is open 24 hours a day. Summary  Taking steps to learn and use tension reduction techniques can help calm you and help prevent triggering an anxiety reaction.  When used together, medicines, psychotherapy, and tension reduction techniques may be the most effective treatment.  Family,  friends, and partners can play a big part in helping you recover from an anxiety disorder. This information is not intended to replace advice given to you by your health care provider. Make sure you discuss any questions you have with your health care provider. Document Revised: 08/21/2018 Document Reviewed: 08/21/2018 Elsevier Patient Education  2020 [employer].

## 2019-07-17 NOTE — Assessment & Plan Note (Signed)
GAD elevated at 19, buspar not helping. Denies SI and HI. Referral to therapy made urgent.-Female requested, would like spanish if available. Starting Lexapro daily, buspar daily, and close follow up 2 weeks. Education on self care and ideas to help with anxiety reduction discussed

## 2019-07-24 ENCOUNTER — Telehealth: Payer: Medicaid Other | Admitting: Family Medicine

## 2019-07-31 ENCOUNTER — Telehealth (INDEPENDENT_AMBULATORY_CARE_PROVIDER_SITE_OTHER): Payer: Medicaid Other | Admitting: Family Medicine

## 2019-07-31 ENCOUNTER — Other Ambulatory Visit: Payer: Self-pay

## 2019-07-31 ENCOUNTER — Encounter: Payer: Self-pay | Admitting: Family Medicine

## 2019-07-31 VITALS — BP 122/60 | Ht 66.0 in | Wt 170.0 lb

## 2019-07-31 DIAGNOSIS — F419 Anxiety disorder, unspecified: Secondary | ICD-10-CM

## 2019-07-31 NOTE — Assessment & Plan Note (Signed)
GAD has reduced to 5 from 19.  Denies having any SI or HI.  Doing much better on the Lexapro daily and BuSpar twice daily.  Has decided to take the job down in [place].  Is down there right now with her family looking at things.  Possibly might be moving or commuting back and forth in between her days that she works in days that she is off.  We will continue Lexapro and BuSpar for now.

## 2019-07-31 NOTE — Progress Notes (Signed)
Virtual Visit via Video Note   This visit type was conducted due to national recommendations for restrictions regarding the COVID-19 Pandemic (e.g. social distancing) in an effort to limit this patient's exposure and mitigate transmission in our community.  Due to her co-morbid illnesses, this patient is at least at moderate risk for complications without adequate follow up.  This format is felt to be most appropriate for this patient at this time.  All issues noted in this document were discussed and addressed.  A limited physical exam was performed with this format.    Evaluation Performed:  Follow-up visit  Date:  07/31/2019   ID:  Caitlin Higgins, Caitlin Higgins 12/26/1985, MRN [id number]  Patient Location: Home Provider Location: Office  Location of Patient: Home Location of Provider: Telehealth Consent was obtain for visit to be over via telehealth. I verified that I am speaking with the correct person using two identifiers.  PCP:  Freddy Finner, NP   Chief Complaint: Anxiety  History of Present Illness:    Caitlin Higgins is a 34 y.o. female with anxiety, caregiver role strain among other events.  Previously seen 2 weeks ago started on Lexapro and BuSpar.  Has been taking the Lexapro daily and the BuSpar twice daily.  She can tell a "big difference".  Would like to stay on them for now.  Overall she feels good she is taken the job down in [place] that she had spoken about and is hoping that that will help her with her family.  The patient does not have symptoms concerning for COVID-19 infection (fever, chills, cough, or new shortness of breath).   Past Medical, Surgical, Social History, Allergies, and Medications have been Reviewed.  Past Medical History:  Diagnosis Date  . Abnormal Papanicolaou smear of cervix with positive human papilloma virus (HPV) test 01/21/2019   Pap +HPV 16 and ASCH, will get colpo  . Acute pain of left knee 03/25/2019  . Anxiety   . Atypical squamous cells  cannot exclude high grade squamous intraepithelial lesion on cytologic smear of cervix (ASC-H) 01/21/2019   Pap +HPV 16 and ASCH, will get colpo  . Cervical high risk HPV (human papillomavirus) test positive 01/24/2017   Repeat pap in 1 year  . Encounter for insertion of mirena IUD 04/28/2017   04/28/17   . Encounter for routine gynecological examination with Papanicolaou smear of cervix 01/09/2019  . Encounter for well woman exam with routine gynecological exam 01/09/2019  . IUD (intrauterine device) in place 01/09/2019  . Migraine   . PCOS (polycystic ovarian syndrome)   . Skin tag 04/25/2019   Past Surgical History:  Procedure Laterality Date  . BUTTOCK LIFT Bilateral 03/21/18     Current Meds  Medication Sig  . busPIRone (BUSPAR) 5 MG tablet Take 1 tablet (5 mg total) by mouth 2 (two) times daily.  . butalbital-acetaminophen-caffeine (FIORICET) 50-325-40 MG tablet Take 1-2 tablets by mouth every 8 (eight) hours as needed for migraine.  Marland Kitchen escitalopram (LEXAPRO) 10 MG tablet Take 1 tablet (10 mg total) by mouth daily.  Marland Kitchen levonorgestrel (MIRENA) 20 MCG/24HR IUD 1 each by Intrauterine route once.  . rizatriptan (MAXALT) 5 MG tablet Take 1 tablet (5 mg total) by mouth as needed for migraine. May repeat in 2 hours if needed     Allergies:   Patient has no known allergies.   ROS:   Please see the history of present illness.    All other systems reviewed and  are negative.   Labs/Other Tests and Data Reviewed:    Recent Labs: 01/10/2019: ALT 11; BUN 11; Creatinine, Ser 0.66; Hemoglobin 13.2; Platelets 205; Potassium 4.0; Sodium 140; TSH 2.830   Recent Lipid Panel Lab Results  Component Value Date/Time   CHOL 170 01/10/2019 08:40 AM   TRIG 61 01/10/2019 08:40 AM   HDL 29 (L) 01/10/2019 08:40 AM   CHOLHDL 5.9 (H) 01/10/2019 08:40 AM   LDLCALC 129 (H) 01/10/2019 08:40 AM    Wt Readings from Last 3 Encounters:  07/31/19 170 lb (77.1 kg)  07/16/19 170 lb (77.1 kg)  05/07/19 170 lb  (77.1 kg)     Objective:    Vital Signs:  BP 122/60   Ht 5\' 6"  (1.676 m)   Wt 170 lb (77.1 kg)   BMI 27.44 kg/m    VITAL SIGNS:  reviewed GEN:  no acute distress EYES:  sclerae anicteric, EOMI - Extraocular Movements Intact RESPIRATORY:  normal respiratory effort, symmetric expansion SKIN:  no rash, lesions or ulcers. MUSCULOSKELETAL:  no obvious deformities. NEURO:  alert and oriented x 3, no obvious focal deficit PSYCH:  normal affect  GAD 7 : Generalized Anxiety Score 07/31/2019 07/16/2019 02/07/2019 01/23/2019  Nervous, Anxious, on Edge 1 3 0 1  Control/stop worrying 1 3 0 0  Worry too much - different things 1 3 0 0  Trouble relaxing 1 3 0 0  Restless 0 2 0 0  Easily annoyed or irritable 1 3 0 1  Afraid - awful might happen 0 2 0 1  Total GAD 7 Score 5 19 0 3  Anxiety Difficulty Not difficult at all Very difficult Not difficult at all Somewhat difficult    Depression screen [facility] 2/9 07/31/2019 04/25/2019 03/25/2019 02/07/2019 01/23/2019  Decreased Interest 0 0 0 0 0  Down, Depressed, Hopeless 0 0 0 0 0  PHQ - 2 Score 0 0 0 0 0  Altered sleeping 0 - - - -  Tired, decreased energy 0 - - - -  Change in appetite 0 - - - -  Feeling bad or failure about yourself  0 - - - -  Trouble concentrating 1 - - - -  Moving slowly or fidgety/restless 1 - - - -  Suicidal thoughts 0 - - - -  PHQ-9 Score 2 - - - -  Difficult doing work/chores Somewhat difficult - - - -     ASSESSMENT & PLAN:     1. Anxiety   Time:   Today, I have spent 10 minutes with the patient with telehealth technology discussing the above problems.     Medication Adjustments/Labs and Tests Ordered: Current medicines are reviewed at length with the patient today.  Concerns regarding medicines are outlined above.   Tests Ordered: No orders of the defined types were placed in this encounter.   Medication Changes: No orders of the defined types were placed in this encounter.   Disposition:  Follow up 3  months  Signed, Perlie Mayo, NP  07/31/2019 4:29 PM     [facility]

## 2019-07-31 NOTE — Patient Instructions (Signed)
I appreciate the opportunity to provide you with care for your health and wellness. Today we discussed: anxiety  Follow up: 3 months  No labs or referrals today  SOOO HAPPY YOU FEEL BETTER :)  Continue taking the medications as directed.  Enjoy the new job. Stay safe traveling.   Please continue to practice social distancing to keep you, your family, and our community safe.  If you must go out, please wear a mask and practice good handwashing.  It was a pleasure to see you and I look forward to continuing to work together on your health and well-being. Please do not hesitate to call the office if you need care or have questions about your care.  Have a wonderful day and week. With Gratitude, Tereasa Coop, DNP, AGNP-BC

## 2019-09-19 ENCOUNTER — Encounter: Payer: Self-pay | Admitting: Family Medicine

## 2019-09-20 ENCOUNTER — Other Ambulatory Visit: Payer: Self-pay

## 2019-09-20 DIAGNOSIS — G43809 Other migraine, not intractable, without status migrainosus: Secondary | ICD-10-CM

## 2019-09-20 MED ORDER — RIZATRIPTAN BENZOATE 5 MG PO TABS: 5.0000 mg | ORAL_TABLET | ORAL | 0 refills | Status: DC | PRN

## 2019-10-15 ENCOUNTER — Encounter: Payer: Medicaid Other | Admitting: Family Medicine

## 2019-12-17 ENCOUNTER — Telehealth (INDEPENDENT_AMBULATORY_CARE_PROVIDER_SITE_OTHER): Payer: Medicaid Other | Admitting: Family Medicine

## 2019-12-17 ENCOUNTER — Telehealth: Payer: Self-pay | Admitting: Orthopaedic Surgery

## 2019-12-17 ENCOUNTER — Encounter: Payer: Self-pay | Admitting: Family Medicine

## 2019-12-17 ENCOUNTER — Other Ambulatory Visit: Payer: Self-pay

## 2019-12-17 DIAGNOSIS — G43809 Other migraine, not intractable, without status migrainosus: Secondary | ICD-10-CM

## 2019-12-17 MED ORDER — RIZATRIPTAN BENZOATE 5 MG PO TABS: 5.0000 mg | ORAL_TABLET | ORAL | 1 refills | Status: AC | PRN

## 2019-12-17 NOTE — Progress Notes (Signed)
Virtual Visit via Telephone Note   This visit type was conducted due to national recommendations for restrictions regarding the COVID-19 Pandemic (e.g. social distancing) in an effort to limit this patient's exposure and mitigate transmission in our community.  Due to her co-morbid illnesses, this patient is at least at moderate risk for complications without adequate follow up.  This format is felt to be most appropriate for this patient at this time.  The patient did not have access to video technology/had technical difficulties with video requiring transitioning to audio format only (telephone).  All issues noted in this document were discussed and addressed.  No physical exam could be performed with this format.    Evaluation Performed:  Follow-up visit  Date:  12/17/2019   ID:  Caitlin Higgins, DOB November 24, 1985, MRN [id number]  Patient Location: Home Provider Location: Office/Clinic  Location of Patient: Home Location of Provider: Telehealth Consent was obtain for visit to be over via telehealth. I verified that I am speaking with the correct person using two identifiers.  PCP:  Freddy Finner, NP   Chief Complaint: Acute visit for migraines  History of Present Illness:    Caitlin Higgins is a 34 y.o. female with history of migraines.  Reports that her headaches have lasted for 1 to 2-day.  Reports that she feels like she needs a refill of the Maxalt medication.  She tried over-the-counter Tylenol and ibuprofen as she usually did but they just are not working anymore at this time.  She reports watery eyes, light and sound sensitivity some nausea but no vomiting at this time.  The patient does not have symptoms concerning for COVID-19 infection (fever, chills, cough, or new shortness of breath).   Past Medical, Surgical, Social History, Allergies, and Medications have been Reviewed.  Past Medical History:  Diagnosis Date  . Abnormal Papanicolaou smear of cervix with positive human  papilloma virus (HPV) test 01/21/2019   Pap +HPV 16 and ASCH, will get colpo  . Acute pain of left knee 03/25/2019  . Anxiety   . Atypical squamous cells cannot exclude high grade squamous intraepithelial lesion on cytologic smear of cervix (ASC-H) 01/21/2019   Pap +HPV 16 and ASCH, will get colpo  . Cervical high risk HPV (human papillomavirus) test positive 01/24/2017   Repeat pap in 1 year  . Encounter for insertion of mirena IUD 04/28/2017   04/28/17   . Encounter for routine gynecological examination with Papanicolaou smear of cervix 01/09/2019  . Encounter for well woman exam with routine gynecological exam 01/09/2019  . IUD (intrauterine device) in place 01/09/2019  . Migraine   . PCOS (polycystic ovarian syndrome)   . Skin tag 04/25/2019   Past Surgical History:  Procedure Laterality Date  . BUTTOCK LIFT Bilateral 03/21/18     Current Meds  Medication Sig  . busPIRone (BUSPAR) 5 MG tablet Take 1 tablet (5 mg total) by mouth 2 (two) times daily.  Marland Kitchen escitalopram (LEXAPRO) 10 MG tablet Take 1 tablet (10 mg total) by mouth daily.  Marland Kitchen levonorgestrel (MIRENA) 20 MCG/24HR IUD 1 each by Intrauterine route once.  . rizatriptan (MAXALT) 5 MG tablet Take 1 tablet (5 mg total) by mouth as needed for migraine. May repeat in 2 hours if needed     Allergies:   Patient has no known allergies.   ROS:   Please see the history of present illness.    All other systems reviewed and are negative.   Labs/Other  Tests and Data Reviewed:    Recent Labs: 01/10/2019: ALT 11; BUN 11; Creatinine, Ser 0.66; Hemoglobin 13.2; Platelets 205; Potassium 4.0; Sodium 140; TSH 2.830   Recent Lipid Panel Lab Results  Component Value Date/Time   CHOL 170 01/10/2019 08:40 AM   TRIG 61 01/10/2019 08:40 AM   HDL 29 (L) 01/10/2019 08:40 AM   CHOLHDL 5.9 (H) 01/10/2019 08:40 AM   LDLCALC 129 (H) 01/10/2019 08:40 AM    Wt Readings from Last 3 Encounters:  12/17/19 170 lb (77.1 kg)  07/31/19 170 lb (77.1 kg)    07/16/19 170 lb (77.1 kg)     Objective:    Vital Signs:  BP 122/60   Ht 5\' 6"  (1.676 m)   Wt 170 lb (77.1 kg)   BMI 27.44 kg/m    VITAL SIGNS:  reviewed GEN:  Alert and oriented RESPIRATORY:  No shortness of breath noted in conversation PSYCH:  normal affect  ASSESSMENT & PLAN:    1. Other migraine without status migrainosus, not intractable  - rizatriptan (MAXALT) 5 MG tablet; Take 1 tablet (5 mg total) by mouth as needed for migraine. May repeat in 2 hours if needed  Dispense: 10 tablet; Refill: 1   Time:   Today, I have spent 5 minutes with the patient with telehealth technology discussing the above problems.     Medication Adjustments/Labs and Tests Ordered: Current medicines are reviewed at length with the patient today.  Concerns regarding medicines are outlined above.   Tests Ordered: No orders of the defined types were placed in this encounter.   Medication Changes: No orders of the defined types were placed in this encounter.   Disposition:  Follow up  3 months for CPE Signed, , NP  12/17/2019 11:42 AM     12/19/2019 [facility]

## 2019-12-17 NOTE — Telephone Encounter (Signed)
She has partial tear of the meniscus.  I had offered she see Dr. Romeo Apple to discuss further.  I did mention both the cortisone and the viscosupplementation gel.  I feel that since she has a meniscus partial tear the cortisone will probably be the best route.  The gel works better with arthritis.    Again, she should see Dr. Romeo Apple to discuss further.

## 2019-12-17 NOTE — Patient Instructions (Signed)
I appreciate the opportunity to provide you with care for your health and wellness. Today we discussed: Migraines  Follow up: 3 to 4 months for CPE, fasting appointment  No labs or referrals today  Hopefully a migraine sleep.  If they continue to worsen or do not respond to this medication please let me know.  Please continue to practice social distancing to keep you, your family, and our community safe.  If you must go out, please wear a mask and practice good handwashing.  It was a pleasure to see you and I look forward to continuing to work together on your health and well-being. Please do not hesitate to call the office if you need care or have questions about your care.  Have a wonderful day and week. With Gratitude, Tereasa Coop, DNP, AGNP-BC

## 2019-12-17 NOTE — Telephone Encounter (Signed)
Patient called to inquire about the type of injection Dr Hilda Lias discussed with her at time of last visit 05/07/19, as an alternative to surgery.  Was it gel injection, or steroid injection?  Patient would like to know before she schedules appointment.  [phone]

## 2019-12-17 NOTE — Assessment & Plan Note (Signed)
Not controlled at this time, provided with refill on Maxalt. Advised to reach back out if migraines do not respond or become worsening in symptoms

## 2019-12-18 NOTE — Telephone Encounter (Signed)
Appointment scheduled patient aware  

## 2019-12-20 ENCOUNTER — Ambulatory Visit (INDEPENDENT_AMBULATORY_CARE_PROVIDER_SITE_OTHER): Payer: Medicaid Other | Admitting: Adult Health

## 2019-12-20 ENCOUNTER — Other Ambulatory Visit (HOSPITAL_COMMUNITY)
Admission: RE | Admit: 2019-12-20 | Discharge: 2019-12-20 | Disposition: A | Discharge status: Home / Self Care | Payer: Medicaid Other | Source: Ambulatory Visit | Attending: Adult Health | Admitting: Adult Health

## 2019-12-20 ENCOUNTER — Encounter: Payer: Self-pay | Admitting: Adult Health

## 2019-12-20 ENCOUNTER — Other Ambulatory Visit: Payer: Self-pay

## 2019-12-20 VITALS — BP 109/62 | HR 63 | Ht 66.0 in | Wt 172.0 lb

## 2019-12-20 DIAGNOSIS — R140 Abdominal distension (gaseous): Secondary | ICD-10-CM | POA: Insufficient documentation

## 2019-12-20 DIAGNOSIS — N760 Acute vaginitis: Secondary | ICD-10-CM

## 2019-12-20 DIAGNOSIS — N926 Irregular menstruation, unspecified: Secondary | ICD-10-CM | POA: Insufficient documentation

## 2019-12-20 DIAGNOSIS — B9689 Other specified bacterial agents as the cause of diseases classified elsewhere: Secondary | ICD-10-CM | POA: Insufficient documentation

## 2019-12-20 DIAGNOSIS — Z975 Presence of (intrauterine) contraceptive device: Secondary | ICD-10-CM

## 2019-12-20 DIAGNOSIS — N898 Other specified noninflammatory disorders of vagina: Secondary | ICD-10-CM | POA: Diagnosis not present

## 2019-12-20 DIAGNOSIS — N941 Unspecified dyspareunia: Secondary | ICD-10-CM

## 2019-12-20 LAB — POCT WET PREP (WET MOUNT)
Clue Cells Wet Prep Whiff POC: POSITIVE
WBC, Wet Prep HPF POC: POSITIVE

## 2019-12-20 MED ORDER — METRONIDAZOLE 500 MG PO TABS
500.0000 mg | ORAL_TABLET | Freq: Two times a day (BID) | ORAL | 0 refills | Status: DC
Start: 2019-12-20 — End: 2020-02-21

## 2019-12-20 NOTE — Progress Notes (Signed)
  Subjective:     Patient ID: Caitlin Higgins, female   DOB: July 26, 1985, 34 y.o.   MRN: [id number]  HPI Caitlin Higgins is a 34 year old white female,single, G2P2 in complaining of bleeding with IUD a few weeks ago and bloating and pain with sex. Usually has no period with IUD. PCP is Tereasa Coop NP.  Review of Systems Denies any new partners See HPI for positives Reviewed past medical,surgical, social and family history. Reviewed medications and allergies.     Objective:   Physical Exam BP 109/62 (BP Location: Left Arm, Patient Position: Sitting, Cuff Size: Normal)   Pulse 63   Ht 5\' 6"  (1.676 m)   Wt 172 lb (78 kg)   BMI 27.76 kg/m   Skin warm and dry.Pelvic: external genitalia is normal in appearance no lesions, vagina: tan frothy discharge with odor,urethra has no lesions or masses noted, cervix:bulbous,+IUD strings uterus: normal size, shape and contour, non tender, no masses felt, adnexa: no masses or tenderness noted. Bladder is non tender and no masses felt. Wet prep: + for clue cells and +WBCs. CV swab obtained Examination chaperoned by LPN   [facility] - 12/20/19 1115      Pregnancy Intention Screening   Does the patient want to become pregnant in the next year? No    Does the patient's partner want to become pregnant in the next year? No    Would the patient like to discuss contraceptive options today? No      Contraception Wrap Up   Current Method IUD or IUS    End Method IUD or IUS    Contraception Counseling Provided No             Assessment:     1. Vaginal discharge CV swab sent   2. BV (bacterial vaginosis) Rx flagyl 500 mg 1 bid x 7 days, no alcohol, review handout on BV    No sex during treatment  3. Dyspareunia in female  4. Bloating  5. IUD (intrauterine device) in place  6. Irregular bleeding CV swab sent    Plan:     Return in 4 weeks for pap and physical

## 2019-12-20 NOTE — Patient Instructions (Signed)
Bacterial Vaginosis  Bacterial vaginosis is a vaginal infection that occurs when the normal balance of bacteria in the vagina is disrupted. It results from an overgrowth of certain bacteria. This is the most common vaginal infection among women ages 15-44. Because bacterial vaginosis increases your risk for STIs (sexually transmitted infections), getting treated can help reduce your risk for chlamydia, gonorrhea, herpes, and HIV (human immunodeficiency virus). Treatment is also important for preventing complications in pregnant women, because this condition can cause an early (premature) delivery. What are the causes? This condition is caused by an increase in harmful bacteria that are normally present in small amounts in the vagina. However, the reason that the condition develops is not fully understood. What increases the risk? The following factors may make you more likely to develop this condition:  Having a new sexual partner or multiple sexual partners.  Having unprotected sex.  Douching.  Having an intrauterine device (IUD).  Smoking.  Drug and alcohol abuse.  Taking certain antibiotic medicines.  Being pregnant. You cannot get bacterial vaginosis from toilet seats, bedding, swimming pools, or contact with objects around you. What are the signs or symptoms? Symptoms of this condition include:  Grey or white vaginal discharge. The discharge can also be watery or foamy.  A fish-like odor with discharge, especially after sexual intercourse or during menstruation.  Itching in and around the vagina.  Burning or pain with urination. Some women with bacterial vaginosis have no signs or symptoms. How is this diagnosed? This condition is diagnosed based on:  Your medical history.  A physical exam of the vagina.  Testing a sample of vaginal fluid under a microscope to look for a large amount of bad bacteria or abnormal cells. Your health care provider may use a cotton swab or  a small wooden spatula to collect the sample. How is this treated? This condition is treated with antibiotics. These may be given as a pill, a vaginal cream, or a medicine that is put into the vagina (suppository). If the condition comes back after treatment, a second round of antibiotics may be needed. Follow these instructions at home: Medicines  Take over-the-counter and prescription medicines only as told by your health care provider.  Take or use your antibiotic as told by your health care provider. Do not stop taking or using the antibiotic even if you start to feel better. General instructions  If you have a female sexual partner, tell her that you have a vaginal infection. She should see her health care provider and be treated if she has symptoms. If you have a female sexual partner, he does not need treatment.  During treatment: ? Avoid sexual activity until you finish treatment. ? Do not douche. ? Avoid alcohol as directed by your health care provider. ? Avoid breastfeeding as directed by your health care provider.  Drink enough water and fluids to keep your urine clear or pale yellow.  Keep the area around your vagina and rectum clean. ? Wash the area daily with warm water. ? Wipe yourself from front to back after using the toilet.  Keep all follow-up visits as told by your health care provider. This is important. How is this prevented?  Do not douche.  Wash the outside of your vagina with warm water only.  Use protection when having sex. This includes latex condoms and dental dams.  Limit how many sexual partners you have. To help prevent bacterial vaginosis, it is best to have sex with just one partner (  monogamous).  Make sure you and your sexual partner are tested for STIs.  Wear cotton or cotton-lined underwear.  Avoid wearing tight pants and pantyhose, especially during summer.  Limit the amount of alcohol that you drink.  Do not use any products that contain  nicotine or tobacco, such as cigarettes and e-cigarettes. If you need help quitting, ask your health care provider.  Do not use illegal drugs. Where to find more information  Centers for Disease Control and Prevention: www.cdc.gov/std  American Sexual Health Association (ASHA): www.ashastd.org  [place] Department of Health and Human Services, Office on Women's Health: www.womenshealth.gov/ or https://www.womenshealth.gov/a-z-topics/bacterial-vaginosis Contact a health care provider if:  Your symptoms do not improve, even after treatment.  You have more discharge or pain when urinating.  You have a fever.  You have pain in your abdomen.  You have pain during sex.  You have vaginal bleeding between periods. Summary  Bacterial vaginosis is a vaginal infection that occurs when the normal balance of bacteria in the vagina is disrupted.  Because bacterial vaginosis increases your risk for STIs (sexually transmitted infections), getting treated can help reduce your risk for chlamydia, gonorrhea, herpes, and HIV (human immunodeficiency virus). Treatment is also important for preventing complications in pregnant women, because the condition can cause an early (premature) delivery.  This condition is treated with antibiotic medicines. These may be given as a pill, a vaginal cream, or a medicine that is put into the vagina (suppository). This information is not intended to replace advice given to you by your health care provider. Make sure you discuss any questions you have with your health care provider. Document Revised: 03/03/2017 Document Reviewed: 12/05/2015 Elsevier Patient Education  2020 [employer]  

## 2019-12-23 ENCOUNTER — Telehealth: Payer: Self-pay | Admitting: Adult Health

## 2019-12-23 ENCOUNTER — Encounter: Payer: Self-pay | Admitting: Adult Health

## 2019-12-23 DIAGNOSIS — A749 Chlamydial infection, unspecified: Secondary | ICD-10-CM

## 2019-12-23 HISTORY — DX: Chlamydial infection, unspecified: A74.9

## 2019-12-23 LAB — CERVICOVAGINAL ANCILLARY ONLY
Bacterial Vaginitis (gardnerella): POSITIVE — AB
Candida Glabrata: NEGATIVE
Candida Vaginitis: NEGATIVE
Chlamydia: POSITIVE — AB
Comment: NEGATIVE
Comment: NEGATIVE
Comment: NEGATIVE
Comment: NEGATIVE
Comment: NEGATIVE
Comment: NORMAL
Neisseria Gonorrhea: NEGATIVE
Trichomonas: NEGATIVE

## 2019-12-23 MED ORDER — AZITHROMYCIN 500 MG PO TABS: ORAL_TABLET | ORAL | 0 refills | Status: DC

## 2019-12-23 NOTE — Telephone Encounter (Signed)
JAG spoke with pt. Caitlin Higgins 

## 2019-12-23 NOTE — Telephone Encounter (Signed)
Patient called stating that she seen her mychart the results of her tests pt states that she would like to start treatment right away [place]ut [place]. Pt would like a call back from [place] or her nurse. Please contact pt

## 2019-12-23 NOTE — Telephone Encounter (Signed)
Rilee is aware of +chlamydia, and +BV already treated for BV, will rx azithromycin 500 mg #2 2 po now  And no sex, POC 10/15 at 12:50 pm, will treat partner Nicholaus Corolla 03/27/95 with azithromycin 500 mg 2 2 po now, called to [facility] in [place], at [phone]. [facility] sent

## 2020-01-09 ENCOUNTER — Ambulatory Visit: Payer: Medicaid Other | Admitting: Orthopedic Surgery

## 2020-01-09 ENCOUNTER — Encounter: Payer: Self-pay | Admitting: Orthopedic Surgery

## 2020-01-17 ENCOUNTER — Ambulatory Visit: Payer: Medicaid Other | Admitting: Adult Health

## 2020-01-23 ENCOUNTER — Encounter: Payer: Self-pay | Admitting: Family Medicine

## 2020-01-24 ENCOUNTER — Other Ambulatory Visit: Payer: Medicaid Other | Admitting: Adult Health

## 2020-01-31 ENCOUNTER — Ambulatory Visit (INDEPENDENT_AMBULATORY_CARE_PROVIDER_SITE_OTHER): Payer: Medicaid Other | Admitting: Adult Health

## 2020-01-31 ENCOUNTER — Encounter: Payer: Self-pay | Admitting: Adult Health

## 2020-01-31 ENCOUNTER — Other Ambulatory Visit (HOSPITAL_COMMUNITY)
Admission: RE | Admit: 2020-01-31 | Discharge: 2020-01-31 | Disposition: A | Discharge status: Home / Self Care | Payer: Medicaid Other | Source: Ambulatory Visit | Attending: Adult Health | Admitting: Adult Health

## 2020-01-31 VITALS — BP 114/73 | HR 73 | Ht 66.0 in | Wt 166.0 lb

## 2020-01-31 DIAGNOSIS — Z8619 Personal history of other infectious and parasitic diseases: Secondary | ICD-10-CM | POA: Diagnosis not present

## 2020-01-31 DIAGNOSIS — J358 Other chronic diseases of tonsils and adenoids: Secondary | ICD-10-CM | POA: Diagnosis not present

## 2020-01-31 NOTE — Progress Notes (Signed)
  Subjective:     Patient ID: Caitlin Higgins, female   DOB: [birth year], 34 y.o.   MRN: [id number]  HPI Caitlin Higgins is a 34 year old Hispanic female single, G2P2 in for proof of cure for recent +chlamydia. She has tonsil stone she says but has had oral sex too.  No pain with sex now or bloating  PCP is Tereasa Coop NP  Review of Systems Tonsil stone for about 3 weeks  No pain with sex or bloating now.  Reviewed past medical,surgical, social and family history. Reviewed medications and allergies.     Objective:   Physical Exam BP 114/73 (BP Location: Left Arm, Patient Position: Sitting, Cuff Size: Normal)   Pulse 73   Ht 5\' 6"  (1.676 m)   Wt 166 lb (75.3 kg)   BMI 26.79 kg/m  Skin warm and dry.No swollen lymph nodes in neck, does have what appears to be tonsill stone on the right, chlamydia swab performed. Pelvic: external genitalia is normal in appearance no lesions, vagina: no vaginal discharge or odor,urethra has no lesions or masses noted, cervix: bulbous, +IUD strings,uterus: normal size, shape and contour, non tender, no masses felt, adnexa: no masses or tenderness noted. Bladder is non tender and no masses felt.CV swab obtained for GC/CHL  Examination chaperoned by .    Assessment:     1. History of chlamydia CV swab sent   2. Tonsil stone chlamydia culture sent  See dentist next week    Plan:     Follow up prn

## 2020-02-03 LAB — CERVICOVAGINAL ANCILLARY ONLY
Chlamydia: NEGATIVE
Comment: NEGATIVE
Comment: NORMAL
Neisseria Gonorrhea: NEGATIVE

## 2020-02-06 LAB — CHLAMYDIA CULTURE: Chlamydia Culture: NEGATIVE

## 2020-02-21 ENCOUNTER — Other Ambulatory Visit: Payer: Self-pay

## 2020-02-21 ENCOUNTER — Ambulatory Visit (INDEPENDENT_AMBULATORY_CARE_PROVIDER_SITE_OTHER): Payer: Medicaid Other | Admitting: Adult Health

## 2020-02-21 ENCOUNTER — Encounter: Payer: Self-pay | Admitting: Adult Health

## 2020-02-21 ENCOUNTER — Other Ambulatory Visit (HOSPITAL_COMMUNITY)
Admission: RE | Admit: 2020-02-21 | Discharge: 2020-02-21 | Disposition: A | Discharge status: Home / Self Care | Payer: Medicaid Other | Source: Ambulatory Visit | Attending: Adult Health | Admitting: Adult Health

## 2020-02-21 VITALS — BP 126/79 | HR 89 | Ht 66.0 in | Wt 169.5 lb

## 2020-02-21 DIAGNOSIS — Z0000 Encounter for general adult medical examination without abnormal findings: Secondary | ICD-10-CM | POA: Diagnosis not present

## 2020-02-21 DIAGNOSIS — Z8742 Personal history of other diseases of the female genital tract: Secondary | ICD-10-CM | POA: Insufficient documentation

## 2020-02-21 DIAGNOSIS — Z975 Presence of (intrauterine) contraceptive device: Secondary | ICD-10-CM

## 2020-02-21 DIAGNOSIS — Z01419 Encounter for gynecological examination (general) (routine) without abnormal findings: Secondary | ICD-10-CM | POA: Diagnosis not present

## 2020-02-21 NOTE — Progress Notes (Signed)
Patient ID: [name], female   DOB: 24-Sep-1985, 34 y.o.   MRN: [id number] History of Present Illness: Caitlin Higgins is a 34 year old Hispanic female, single, G2P2, in for well woman gyn exam and pap, her pap 01/09/2019 was [facility] and +HPV 16, she had negative colpo 02/04/2019. She recently was treated for +chlamydia and POT was negative. PCP is Caitlin Coop NP.   Current Medications, Allergies, Past Medical History, Past Surgical History, Family History and Social History were reviewed in [employer] record.     Review of Systems: Patient denies any headaches, hearing loss, fatigue, blurred vision, shortness of breath, chest pain, abdominal pain, problems with bowel movements, urination, or intercourse. No joint pain or mood swings.    Physical Exam:BP 126/79 (BP Location: Left Arm, Patient Position: Sitting, Cuff Size: Normal)   Pulse 89   Ht 5\' 6"  (1.676 m)   Wt 169 lb 8 oz (76.9 kg)   BMI 27.36 kg/m  General:  Well developed, well nourished, no acute distress Skin:  Warm and dry Neck:  Midline trachea, normal thyroid, good ROM, no lymphadenopathy Lungs; Clear to auscultation bilaterally Breast:  No dominant palpable mass, retraction, or nipple discharge,breasts tender UOQ, R>L Cardiovascular: Regular rate and rhythm Abdomen:  Soft, non tender, no hepatosplenomegaly Pelvic:  External genitalia is normal in appearance, no lesions.  The vagina is normal in appearance. Urethra has no lesions or masses. The cervix is bulbous. +IUD stings at os, pap with GC/CHL and high risk HPV genotyping performed.  Uterus is felt to be normal size, shape, and contour.  No adnexal masses or tenderness noted.Bladder is non tender, no masses felt. Extremities/musculoskeletal:  No swelling or varicosities noted, no clubbing or cyanosis Psych:  No mood changes, alert and cooperative,seems happy AA is 0 Fall risk is low PHQ 9 score is 1  [facility] - 02/21/20 1144      Pregnancy Intention  Screening   Does the patient want to become pregnant in the next year? No    Does the patient's partner want to become pregnant in the next year? No    Would the patient like to discuss contraceptive options today? No      Contraception Wrap Up   Current Method IUD or IUS    End Method IUD or IUS    Contraception Counseling Provided No         Examination chaperoned by 02/23/20, LPN   Impression and Plan: 1. Routine medical exam Pap sent  2. Encounter for gynecological examination with Papanicolaou smear of cervix Pap sent  Physical in 1 year Next pap TBD after results back Mammogram at 41  3. IUD (intrauterine device) in place  4. History of abnormal cervical Pap smear Pap sent with high risk HPV genotyping and GC/CHL

## 2020-02-28 LAB — CYTOLOGY - PAP
Adequacy: ABSENT
Chlamydia: NEGATIVE
Comment: NEGATIVE
Comment: NEGATIVE
Comment: NORMAL
High risk HPV: NEGATIVE
Neisseria Gonorrhea: NEGATIVE

## 2020-04-16 ENCOUNTER — Encounter: Payer: Medicaid Other | Admitting: Family Medicine

## 2020-04-17 ENCOUNTER — Encounter: Payer: Medicaid Other | Admitting: Nurse Practitioner

## 2021-02-22 IMAGING — DX DG KNEE 1-2V*L*
2 series · 2 of 2 positions shown · non-contrast
Comparison: None.

CLINICAL DATA: Ongoing knee pain.

EXAM:
LEFT KNEE - 1-2 VIEW

[knee ap]
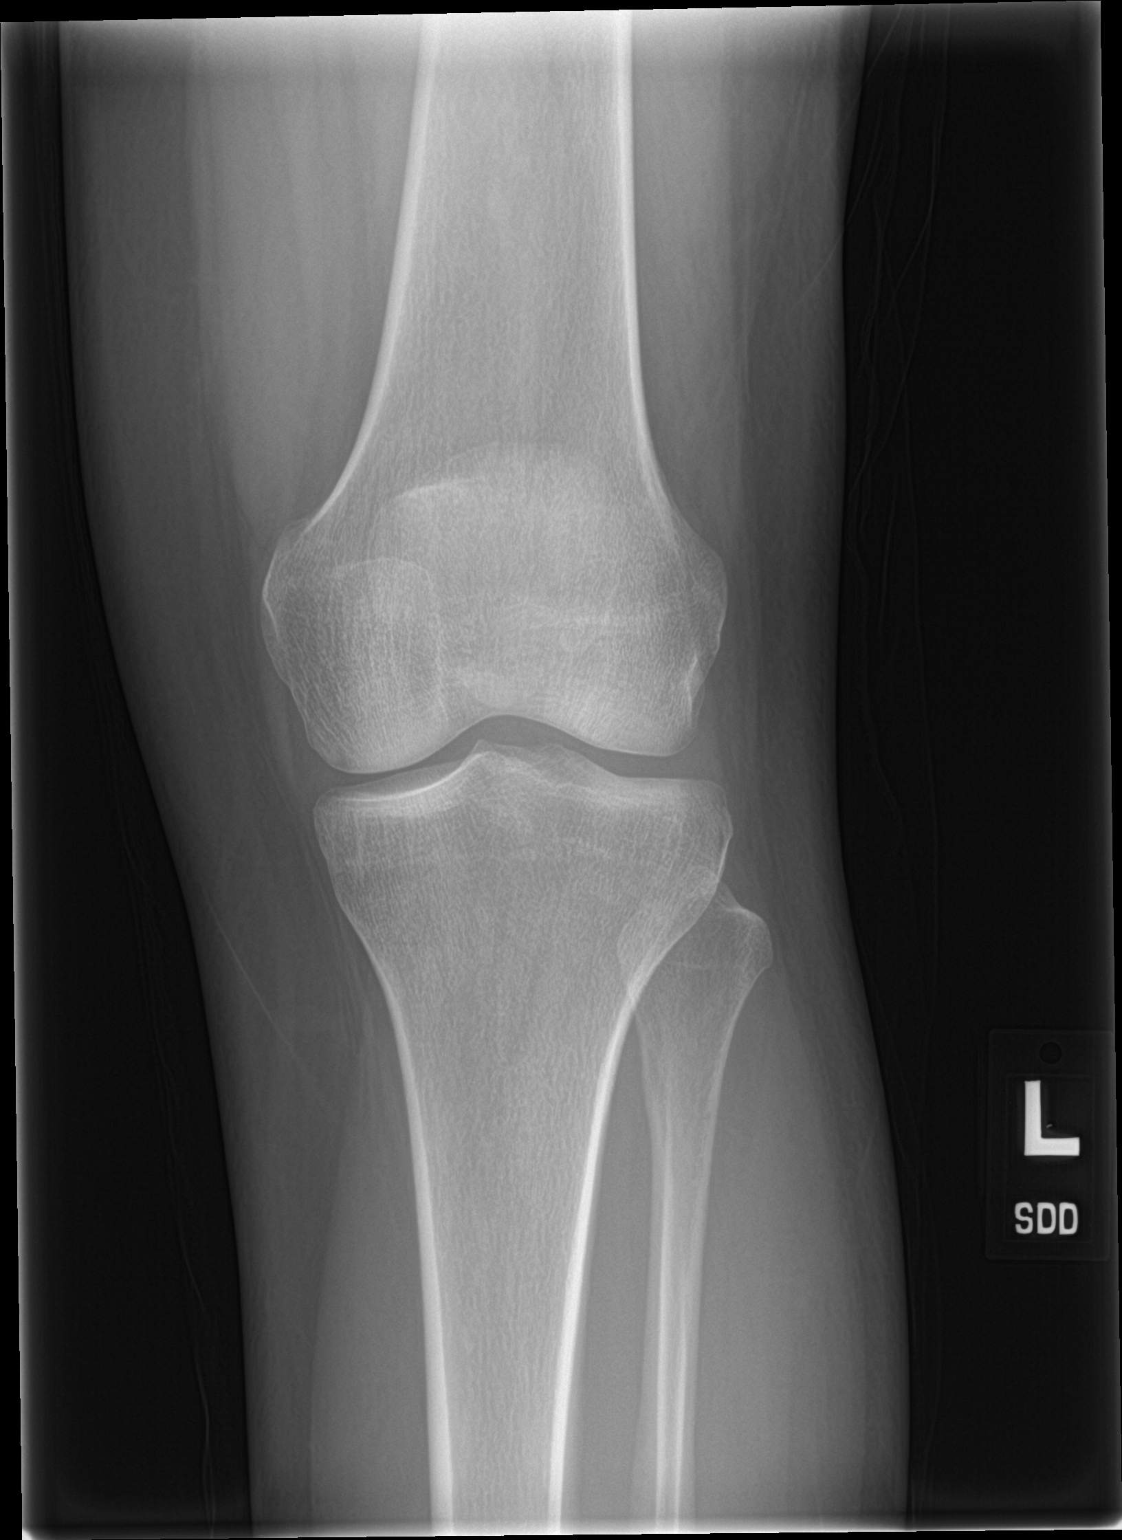

[knee lat]
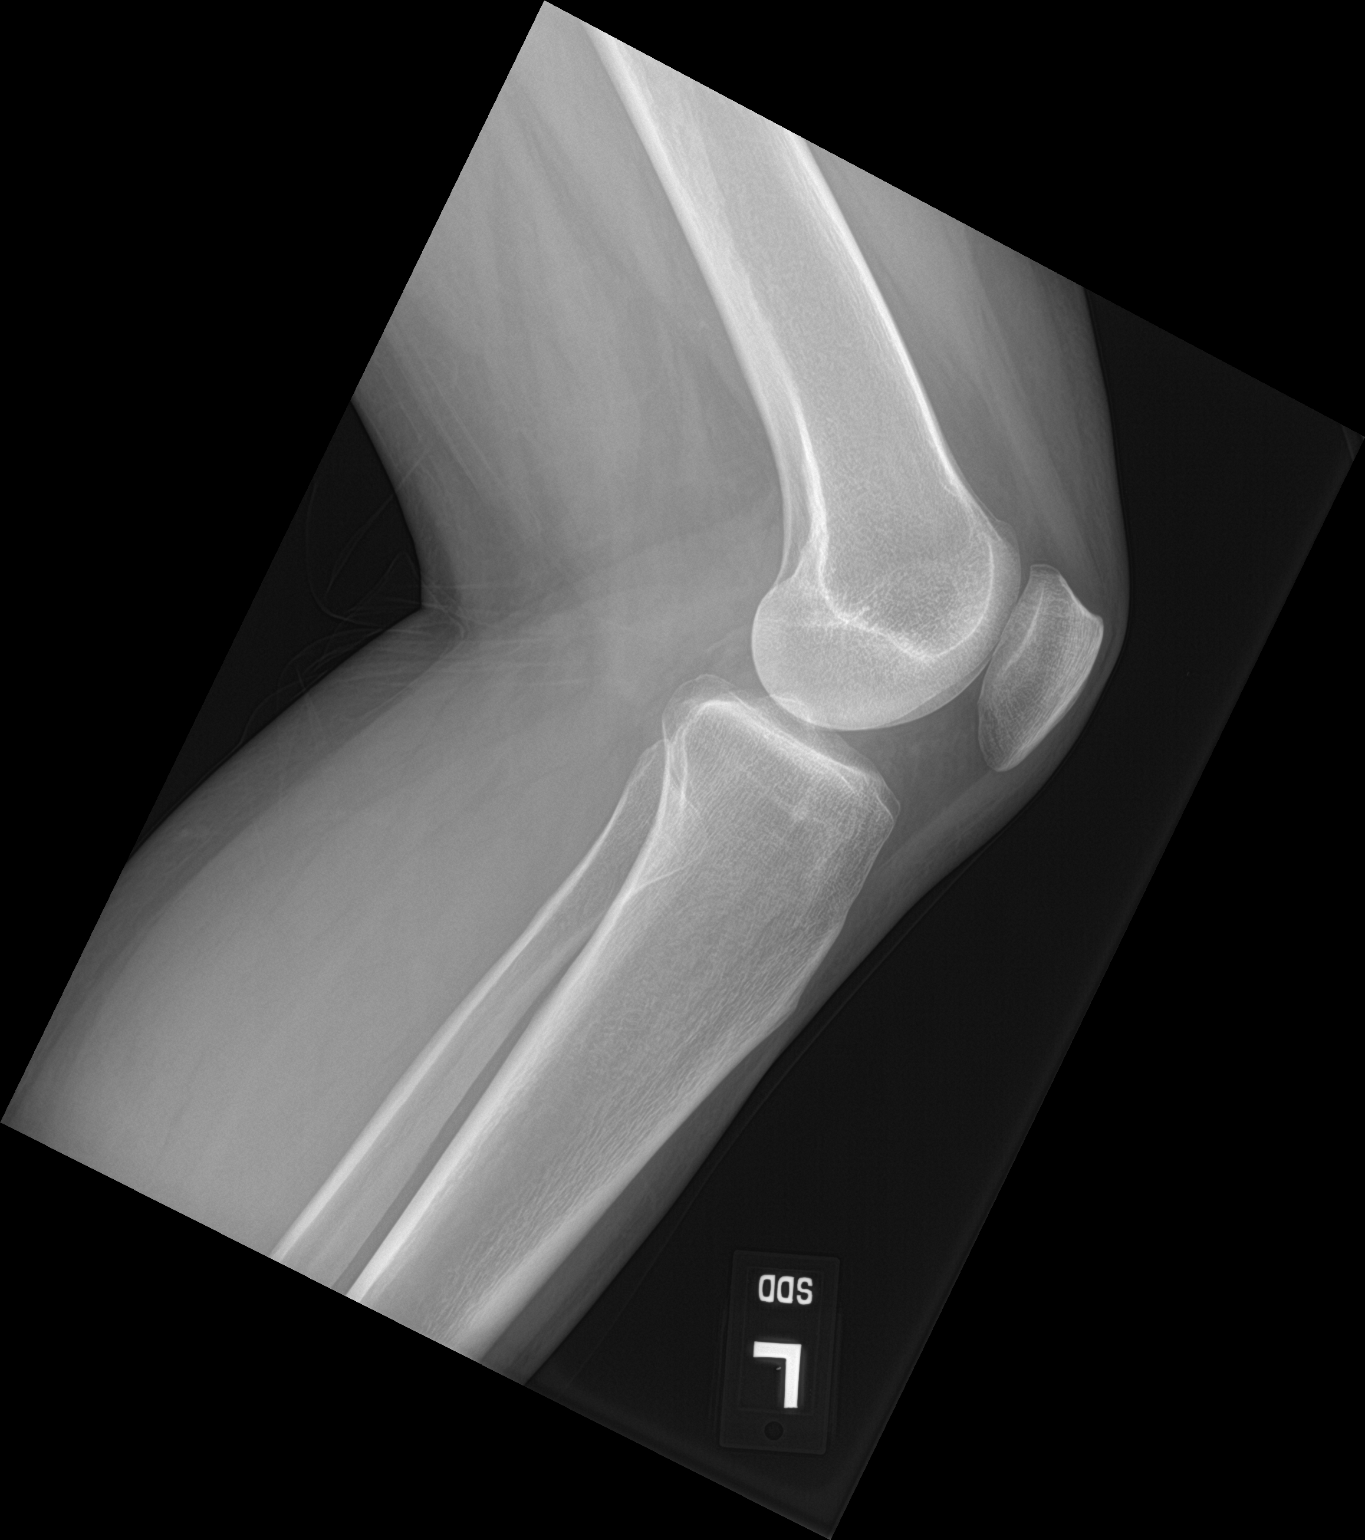

[2 of 2 positions shown; findings below may reference images not displayed]

FINDINGS: There is a subtle lucency involving the medial most aspect of the
medial femoral condyle. There is no displaced fracture. No
dislocation. No significant joint effusion. The joint spaces are
relatively well preserved.
IMPRESSION: Subtle lucency involving the medial most aspect of the medial
femoral condyle could represent a small osteochondral defect. No
displaced fracture or dislocation.

## 2022-06-02 ENCOUNTER — Encounter: Payer: Self-pay | Admitting: Radiology
